# Patient Record
Sex: Female | Born: 1990 | Race: White | Hispanic: No | Marital: Single | Smoking: Current every day smoker
Health system: Southern US, Community
[De-identification: ages and names within clinical notes are randomized; demographics above are authoritative.]

## PROBLEM LIST (undated history)

## (undated) DIAGNOSIS — J029 Acute pharyngitis, unspecified: Secondary | ICD-10-CM

## (undated) DIAGNOSIS — N920 Excessive and frequent menstruation with regular cycle: Secondary | ICD-10-CM

## (undated) DIAGNOSIS — R51 Headache: Secondary | ICD-10-CM

## (undated) DIAGNOSIS — F410 Panic disorder [episodic paroxysmal anxiety] without agoraphobia: Secondary | ICD-10-CM

## (undated) DIAGNOSIS — D649 Anemia, unspecified: Secondary | ICD-10-CM

## (undated) DIAGNOSIS — E039 Hypothyroidism, unspecified: Secondary | ICD-10-CM

## (undated) DIAGNOSIS — F419 Anxiety disorder, unspecified: Secondary | ICD-10-CM

## (undated) DIAGNOSIS — L738 Other specified follicular disorders: Secondary | ICD-10-CM

## (undated) DIAGNOSIS — F32A Depression, unspecified: Secondary | ICD-10-CM

## (undated) DIAGNOSIS — E079 Disorder of thyroid, unspecified: Secondary | ICD-10-CM

## (undated) DIAGNOSIS — F319 Bipolar disorder, unspecified: Secondary | ICD-10-CM

## (undated) DIAGNOSIS — R519 Headache, unspecified: Secondary | ICD-10-CM

## (undated) DIAGNOSIS — T7840XA Allergy, unspecified, initial encounter: Secondary | ICD-10-CM

## (undated) DIAGNOSIS — G43909 Migraine, unspecified, not intractable, without status migrainosus: Secondary | ICD-10-CM

## (undated) HISTORY — DX: Hypothyroidism, unspecified: E03.9

## (undated) HISTORY — DX: Panic disorder (episodic paroxysmal anxiety): F41.0

## (undated) HISTORY — DX: Excessive and frequent menstruation with regular cycle: N92.0

## (undated) HISTORY — DX: Acute pharyngitis, unspecified: J02.9

## (undated) HISTORY — DX: Allergy, unspecified, initial encounter: T78.40XA

## (undated) HISTORY — DX: Other specified follicular disorders: L73.8

## (undated) HISTORY — DX: Disorder of thyroid, unspecified: E07.9

## (undated) HISTORY — DX: Headache, unspecified: R51.9

## (undated) HISTORY — DX: Headache: R51

## (undated) HISTORY — DX: Anemia, unspecified: D64.9

## (undated) HISTORY — DX: Migraine, unspecified, not intractable, without status migrainosus: G43.909

## (undated) HISTORY — PX: TONSILLECTOMY: SHX5217

## (undated) HISTORY — DX: Anxiety disorder, unspecified: F41.9

---
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
## 2011-01-08 ENCOUNTER — Other Ambulatory Visit: Payer: Self-pay | Admitting: Family Medicine

## 2011-01-08 DIAGNOSIS — N920 Excessive and frequent menstruation with regular cycle: Secondary | ICD-10-CM

## 2011-01-12 ENCOUNTER — Other Ambulatory Visit: Payer: Self-pay

## 2011-01-12 ENCOUNTER — Ambulatory Visit
Admission: RE | Admit: 2011-01-12 | Discharge: 2011-01-12 | Disposition: A | Discharge status: Home / Self Care | Payer: PRIVATE HEALTH INSURANCE | Source: Ambulatory Visit | Attending: Family Medicine | Admitting: Family Medicine

## 2011-01-12 DIAGNOSIS — N920 Excessive and frequent menstruation with regular cycle: Secondary | ICD-10-CM

## 2011-01-13 ENCOUNTER — Other Ambulatory Visit: Payer: Self-pay

## 2011-01-18 ENCOUNTER — Other Ambulatory Visit: Payer: Self-pay | Admitting: Family Medicine

## 2011-01-18 DIAGNOSIS — R1011 Right upper quadrant pain: Secondary | ICD-10-CM

## 2011-01-22 ENCOUNTER — Ambulatory Visit
Admission: RE | Admit: 2011-01-22 | Discharge: 2011-01-22 | Disposition: A | Discharge status: Home / Self Care | Payer: PRIVATE HEALTH INSURANCE | Source: Ambulatory Visit | Attending: Family Medicine | Admitting: Family Medicine

## 2011-01-22 DIAGNOSIS — R1011 Right upper quadrant pain: Secondary | ICD-10-CM

## 2011-02-02 ENCOUNTER — Encounter (INDEPENDENT_AMBULATORY_CARE_PROVIDER_SITE_OTHER): Payer: Self-pay | Admitting: Surgery

## 2011-02-11 ENCOUNTER — Encounter (INDEPENDENT_AMBULATORY_CARE_PROVIDER_SITE_OTHER): Payer: Self-pay | Admitting: Surgery

## 2011-02-11 ENCOUNTER — Ambulatory Visit (INDEPENDENT_AMBULATORY_CARE_PROVIDER_SITE_OTHER): Payer: PRIVATE HEALTH INSURANCE | Admitting: Surgery

## 2011-02-11 VITALS — BP 112/72 | HR 60 | Temp 97.2°F | Resp 20 | Ht 59.0 in | Wt 181.1 lb

## 2011-02-11 DIAGNOSIS — K8010 Calculus of gallbladder with chronic cholecystitis without obstruction: Secondary | ICD-10-CM | POA: Insufficient documentation

## 2011-02-11 NOTE — Progress Notes (Signed)
Chief Complaint  Patient presents with  . Cholelithiasis    HPI Carol [name] is a 20 y.o. female. She presents with a six-month history of intermittent right upper quadrant abdominal pain, nausea and constipation. She relates these symptoms to eating greasy foods. Her constipation has improved recently by eating more vegetables and taking a stool softener. She has never had a severe attack that brought her to the emergency room. Her primary care physician check some liver function tests which were normal. An ultrasound showed a 1.6 cm mobile gallstone with no wall thickening and normal common bile duct. She presents now for surgical evaluation. HPI  Past Medical History  Diagnosis Date  . Allergy   . Asthma   . Anxiety   . Thyroid disease   . Menorrhagia   . Other specified disease of hair and hair follicles   . Hypothyroid   . Migraine headache   . Panic disorder   . Anemia   . Sore throat   . Abdominal pain     right   . Constipation   . Nocturnal headaches     due to birth control pills    Past Surgical History  Procedure Date  . Tonsillectomy     History reviewed. No pertinent family history.  Social History History  Substance Use Topics  . Smoking status: Never Smoker   . Smokeless tobacco: Never Used  . Alcohol Use: No    No Known Allergies  Current Outpatient Prescriptions  Medication Sig Dispense Refill  . cephALEXin (KEFLEX) 500 MG capsule       . clonazePAM (KLONOPIN) 0.5 MG tablet Take 0.5 mg by mouth 2 (two) times daily as needed.        . ferrous sulfate 325 (65 FE) MG tablet Take 325 mg by mouth daily with breakfast.        . levothyroxine (SYNTHROID, LEVOTHROID) 88 MCG tablet Take 88 mcg by mouth daily.        . naproxen (NAPROSYN) 500 MG tablet Take 500 mg by mouth 2 (two) times daily with a meal.        . norgestimate-ethinyl estradiol (ORTHO-CYCLEN,SPRINTEC,PREVIFEM) 0.25-35 MG-MCG tablet Take 1 tablet by mouth daily.        Marland Kitchen PARoxetine  (PAXIL) 30 MG tablet Take 30 mg by mouth every morning.        . TRI-SPRINTEC 0.18/0.215/0.25 MG-35 MCG tablet         Review of Systems Review of Systems ROS positive as noted in HPI and also for occasional sore throat and headaches. Blood pressure 112/72, pulse 60, temperature 97.2 F (36.2 C), resp. rate 20, height 4\' 11"  (1.499 m), weight 181 lb 2 oz (82.158 kg).  Physical Exam Physical Exam WDWN in NAD HEENT:  EOMI, sclera anicteric Neck:  No masses, no thyromegaly Lungs:  CTA bilaterally; normal respiratory effort CV:  Regular rate and rhythm; no murmurs Abd:  +bowel sounds, soft, tender in RUQ, epigastrium, no masses Ext:  Well-perfused; no edema Skin:  Warm, dry; no sign of jaundice  Data Reviewed U/S, labs as noted in HPI  Assessment    Chronic calculus cholecystitis    Plan    Recommend laparoscopic cholecystectomy with intraoperative cholangiogram.  I discussed the procedure in detail.  The patient was given Agricultural engineer.  We discussed the risks and benefits of a laparoscopic cholecystectomy including, but not limited to bleeding, infection, injury to surrounding structures such as the intestine or liver, bile leak, retained gallstones,  need to convert to an open procedure, prolonged diarrhea, blood clots such as  DVT, common bile duct injury, anesthesia risks, and possible need for additional procedures.  We discussed the typical post-operative recovery course.        Carol [name] K. 02/11/2011, 10:21 AM

## 2011-02-11 NOTE — Patient Instructions (Signed)
We will schedule your surgery today. 

## 2011-03-05 ENCOUNTER — Encounter: Payer: PRIVATE HEALTH INSURANCE | Admitting: Hematology and Oncology

## 2011-03-12 ENCOUNTER — Other Ambulatory Visit (INDEPENDENT_AMBULATORY_CARE_PROVIDER_SITE_OTHER): Payer: Self-pay | Admitting: Surgery

## 2011-03-12 DIAGNOSIS — K8010 Calculus of gallbladder with chronic cholecystitis without obstruction: Secondary | ICD-10-CM

## 2011-03-12 HISTORY — PX: CHOLECYSTECTOMY: SHX55

## 2011-03-17 ENCOUNTER — Telehealth: Payer: Self-pay | Admitting: Oncology

## 2011-03-17 NOTE — Telephone Encounter (Signed)
per pts request called grandmother and provided appt d/t for 03/18/2011

## 2011-03-18 ENCOUNTER — Telehealth: Payer: Self-pay | Admitting: *Deleted

## 2011-03-18 NOTE — Telephone Encounter (Signed)
CALLED PATIENT GRANDMOTHER INFORM OF THE NEW DATE AND TIME OF THE NEW APPOINTMENT CONFIRMED OVER THE PHONE.

## 2011-03-23 ENCOUNTER — Ambulatory Visit: Payer: PRIVATE HEALTH INSURANCE

## 2011-03-23 ENCOUNTER — Other Ambulatory Visit: Payer: PRIVATE HEALTH INSURANCE | Admitting: Lab

## 2011-03-23 ENCOUNTER — Telehealth: Payer: Self-pay | Admitting: Oncology

## 2011-03-23 ENCOUNTER — Ambulatory Visit: Payer: PRIVATE HEALTH INSURANCE | Admitting: Oncology

## 2011-03-23 ENCOUNTER — Ambulatory Visit (HOSPITAL_BASED_OUTPATIENT_CLINIC_OR_DEPARTMENT_OTHER): Payer: PRIVATE HEALTH INSURANCE | Admitting: Oncology

## 2011-03-23 VITALS — BP 128/79 | HR 97 | Temp 97.8°F | Ht <= 58 in | Wt 183.7 lb

## 2011-03-23 DIAGNOSIS — D649 Anemia, unspecified: Secondary | ICD-10-CM

## 2011-03-23 NOTE — Telephone Encounter (Signed)
Gave patient appointment for 03-24-2011 lab only at 10:00am

## 2011-03-24 ENCOUNTER — Other Ambulatory Visit (HOSPITAL_BASED_OUTPATIENT_CLINIC_OR_DEPARTMENT_OTHER): Payer: PRIVATE HEALTH INSURANCE | Admitting: Lab

## 2011-03-24 DIAGNOSIS — D649 Anemia, unspecified: Secondary | ICD-10-CM

## 2011-03-24 LAB — CBC & DIFF AND RETIC
BASO%: 0.5 % (ref 0.0–2.0)
EOS%: 5.5 % (ref 0.0–7.0)
LYMPH%: 25.4 % (ref 14.0–49.7)
MCH: 25.4 pg (ref 25.1–34.0)
MCHC: 32.7 g/dL (ref 31.5–36.0)
MCV: 77.8 fL — ABNORMAL LOW (ref 79.5–101.0)
MONO#: 0.8 10*3/uL (ref 0.1–0.9)
MONO%: 7.8 % (ref 0.0–14.0)
Platelets: 402 10*3/uL — ABNORMAL HIGH (ref 145–400)
RBC: 4.41 10*6/uL (ref 3.70–5.45)
Retic %: 2.32 % — ABNORMAL HIGH (ref 0.70–2.10)
WBC: 10.1 10*3/uL (ref 3.9–10.3)

## 2011-03-24 LAB — MORPHOLOGY: PLT EST: INCREASED

## 2011-03-24 NOTE — Progress Notes (Signed)
Patient History and Physical   Carol [name] [id number] June 06, 1990 20 y.o. 03/24/2011  CC: Dr Juliet Rude  Chief Complaint: Anemia  HPI:  This is a 20 year old single female from [place], referred for evaluation of anemia. She apparently was noted to be anemic in August of this year. SHe recently underwent a cholecystectomy in October and was referred for further evaluation of anemia. History of menorrhagia which is improved since been on a birth control pill. She does take oral iron once a day. She has no other significant problems that might be contributing to anemia.  PMH: Past Medical History  Diagnosis Date  . Allergy   . Asthma   . Anxiety   . Thyroid disease   . Menorrhagia   . Other specified disease of hair and hair follicles   . Hypothyroid   . Migraine headache   . Panic disorder   . Anemia   . Sore throat   . Abdominal pain     right   . Constipation   . Nocturnal headaches     due to birth control pills    Past Surgical History  Procedure Date  . Tonsillectomy    cholecystectomy 2012  Allergies: Allergies  Allergen Reactions  . Codeine Anaphylaxis    Medications: Medications Prior to Admission  Medication Sig Dispense Refill  . clonazePAM (KLONOPIN) 0.5 MG tablet Take 0.5 mg by mouth 2 (two) times daily as needed.        . ferrous sulfate 325 (65 FE) MG tablet Take 325 mg by mouth daily with breakfast.        . levothyroxine (SYNTHROID, LEVOTHROID) 88 MCG tablet Take 88 mcg by mouth daily.        . norgestimate-ethinyl estradiol (ORTHO-CYCLEN,SPRINTEC,PREVIFEM) 0.25-35 MG-MCG tablet Take 1 tablet by mouth daily.       Marland Kitchen PARoxetine (PAXIL) 30 MG tablet Take 30 mg by mouth every morning.        . TRI-SPRINTEC 0.18/0.215/0.25 MG-35 MCG tablet       . cephALEXin (KEFLEX) 500 MG capsule       . naproxen (NAPROSYN) 500 MG tablet Take 500 mg by mouth 2 (two) times daily with a meal.         No current facility-administered medications on file  as of 03/23/2011.    Social History:   reports that she has never smoked. She has never used smokeless tobacco. She reports that she does not drink alcohol or use illicit drugs. She is a high Garment/textile technologist, lives at home and looks after a-year-old brother Family History: No family history on file. She has younger brothers 45 and 14 as well as a tumor and. There is a history in the family of any neurological disorders.  Review of Systems: Constitutional ROS: Fever -no, Chills, Night Sweats, Anorexia, Pain -no Cardiovascular ROS: positive for - dyspnea on exertion Respiratory ROS: negative Neurological ROS: negative Dermatological ROS: negative ENT ROS: negative Gastrointestinal ROS: no abdominal pain, change in bowel habits, or black or bloody stools Genito-Urinary ROS: no dysuria, trouble voiding, or hematuria Hematological and Lymphatic ROS: negative Breast ROS: negative for breast lumps Musculoskeletal ROS: negative Remaining ROS negative.  Physical Exam: Blood pressure 128/79, pulse 97, temperature 97.8 F (36.6 C), height 4\' 10"  (1.473 m), weight 183 lb 11.2 oz (83.326 kg). General appearance: alert and cooperative Head: Normocephalic, without obvious abnormality, atraumatic Neck: no adenopathy, no carotid bruit, no JVD, supple, symmetrical, trachea midline and thyroid not enlarged, symmetric, no tenderness/mass/nodules  Lymph nodes: Cervical, supraclavicular, and axillary nodes normal. Lungs; normal Cardiac; normal Abdomen; negative for adenopathy, splenomegaly or masses. CNS; grossly normal Extremities no petechia purpura  Lab Results: No results found for this basename: WBC, HGB, HCT, MCV, PLT     Chemistry   No results found for this basename: NA, K, CL, CO2, BUN, CREATININE, GLU   No results found for this basename: CALCIUM, ALKPHOS, AST, ALT, BILITOT       Radiological Studies: n/a  Impression and Plan: 20 yo with history of anemia likely secondary to  menorrhagia. I will await labs and have recommended increasing ferrous sulfate to 3x/day. i will obtain labs and see her in f/u in 2 weeks.     Pierce Crane, MD 03/24/2011, 12:21 AM

## 2011-03-25 LAB — COMPREHENSIVE METABOLIC PANEL
ALT: 16 U/L (ref 0–35)
AST: 13 U/L (ref 0–37)
Albumin: 3.8 g/dL (ref 3.5–5.2)
Alkaline Phosphatase: 102 U/L (ref 39–117)
BUN: 6 mg/dL (ref 6–23)
Chloride: 105 mEq/L (ref 96–112)
Potassium: 4 mEq/L (ref 3.5–5.3)
Sodium: 138 mEq/L (ref 135–145)
Total Protein: 7.1 g/dL (ref 6.0–8.3)

## 2011-03-25 LAB — IRON AND TIBC: UIBC: 343 ug/dL (ref 125–400)

## 2011-03-25 LAB — FOLATE RBC: RBC Folate: 1053 ng/mL (ref >=366)

## 2011-03-29 ENCOUNTER — Telehealth: Payer: Self-pay | Admitting: Oncology

## 2011-03-29 NOTE — Telephone Encounter (Signed)
pts grandmother called about lab results and I informed her that the MD will discuss on 11/23 when she comes in for office visit

## 2011-03-30 ENCOUNTER — Telehealth (INDEPENDENT_AMBULATORY_CARE_PROVIDER_SITE_OTHER): Payer: Self-pay | Admitting: Surgery

## 2011-04-02 ENCOUNTER — Ambulatory Visit (HOSPITAL_BASED_OUTPATIENT_CLINIC_OR_DEPARTMENT_OTHER): Payer: PRIVATE HEALTH INSURANCE | Admitting: Oncology

## 2011-04-02 ENCOUNTER — Telehealth: Payer: Self-pay | Admitting: *Deleted

## 2011-04-02 VITALS — BP 129/78 | HR 102 | Temp 98.1°F | Ht <= 58 in | Wt 183.9 lb

## 2011-04-02 DIAGNOSIS — D509 Iron deficiency anemia, unspecified: Secondary | ICD-10-CM

## 2011-04-02 DIAGNOSIS — D649 Anemia, unspecified: Secondary | ICD-10-CM

## 2011-04-02 NOTE — Progress Notes (Signed)
Carol [name] is here with her grandmother. Her iron stores confirm iron deficiency, likely related to her menorrhagia. She typically does not take iron on a regular basis, I have offered her iv iron, but she would prefer to take the pills. She confirmed understanding of this. i will see her in f/u in 3 months.

## 2011-04-02 NOTE — Telephone Encounter (Signed)
Gave patient appointment for 06-2011 

## 2011-04-06 ENCOUNTER — Encounter (INDEPENDENT_AMBULATORY_CARE_PROVIDER_SITE_OTHER): Payer: Self-pay | Admitting: Surgery

## 2011-04-06 ENCOUNTER — Ambulatory Visit (INDEPENDENT_AMBULATORY_CARE_PROVIDER_SITE_OTHER): Payer: PRIVATE HEALTH INSURANCE | Admitting: Surgery

## 2011-04-06 VITALS — BP 126/78 | HR 70 | Temp 97.2°F | Resp 16 | Ht 59.0 in | Wt 181.0 lb

## 2011-04-06 DIAGNOSIS — K8010 Calculus of gallbladder with chronic cholecystitis without obstruction: Secondary | ICD-10-CM

## 2011-04-06 NOTE — Patient Instructions (Signed)
Fiber supplement for the diarrhea.  Use a heating pad and ibuprofen for the muscle soreness.

## 2011-04-06 NOTE — Progress Notes (Signed)
Filed Vitals:   04/06/11 1626  BP: 126/78  Pulse: 70  Temp: 97.2 F (36.2 C)  Resp: 16   The patient is status post laparoscopic cholecystectomy on 03/12/11 for chronic calculus cholecystitis.  She is having no more nausea and her constipation has resolved. She has some diarrhea with greasy foods.  She complains of some mild RUQ pain with lifting and twisting.  Her incisions are well-healed with no sign of infection.  She does have some skin irritation from some tape, but this seems to be improving.  She is mildly tender around her RUQ port sites, but no sign of infection or hematoma.  Her midline incisions are well-healed.    She should add a fiber supplement for her diarrhea and limit the fat in her diet.  She may use a heating pad and ibuprofen PRN for the muscle soreness.  Follow-up PRN

## 2011-04-20 ENCOUNTER — Encounter (INDEPENDENT_AMBULATORY_CARE_PROVIDER_SITE_OTHER): Payer: Self-pay | Admitting: Surgery

## 2011-04-21 ENCOUNTER — Encounter (INDEPENDENT_AMBULATORY_CARE_PROVIDER_SITE_OTHER): Payer: Self-pay

## 2011-04-21 ENCOUNTER — Encounter (INDEPENDENT_AMBULATORY_CARE_PROVIDER_SITE_OTHER): Payer: Self-pay | Admitting: Family Medicine

## 2011-06-29 ENCOUNTER — Other Ambulatory Visit (HOSPITAL_BASED_OUTPATIENT_CLINIC_OR_DEPARTMENT_OTHER): Payer: PRIVATE HEALTH INSURANCE | Admitting: Lab

## 2011-06-29 DIAGNOSIS — D649 Anemia, unspecified: Secondary | ICD-10-CM

## 2011-06-29 LAB — CBC & DIFF AND RETIC
BASO%: 0.2 % (ref 0.0–2.0)
Eosinophils Absolute: 0.1 10*3/uL (ref 0.0–0.5)
HCT: 35.6 % (ref 34.8–46.6)
Immature Retic Fract: 16.8 % — ABNORMAL HIGH (ref 1.60–10.00)
LYMPH%: 24.7 % (ref 14.0–49.7)
MCHC: 32.6 g/dL (ref 31.5–36.0)
MONO#: 1.8 10*3/uL — ABNORMAL HIGH (ref 0.1–0.9)
NEUT#: 13.4 10*3/uL — ABNORMAL HIGH (ref 1.5–6.5)
NEUT%: 65.6 % (ref 38.4–76.8)
Platelets: 476 10*3/uL — ABNORMAL HIGH (ref 145–400)
Retic %: 3.73 % — ABNORMAL HIGH (ref 0.70–2.10)
WBC: 20.5 10*3/uL — ABNORMAL HIGH (ref 3.9–10.3)
lymph#: 5.1 10*3/uL — ABNORMAL HIGH (ref 0.9–3.3)

## 2011-06-29 LAB — MORPHOLOGY

## 2011-06-29 LAB — FERRITIN: Ferritin: 28 ng/mL (ref 10–291)

## 2011-06-29 LAB — IRON AND TIBC: %SAT: 6 % — ABNORMAL LOW (ref 20–55)

## 2011-07-06 ENCOUNTER — Ambulatory Visit: Payer: PRIVATE HEALTH INSURANCE | Admitting: Lab

## 2011-07-06 ENCOUNTER — Ambulatory Visit (HOSPITAL_BASED_OUTPATIENT_CLINIC_OR_DEPARTMENT_OTHER): Payer: PRIVATE HEALTH INSURANCE | Admitting: Oncology

## 2011-07-06 ENCOUNTER — Telehealth: Payer: Self-pay | Admitting: Oncology

## 2011-07-06 VITALS — BP 137/83 | HR 130 | Temp 97.8°F | Ht 59.0 in | Wt 184.8 lb

## 2011-07-06 DIAGNOSIS — D649 Anemia, unspecified: Secondary | ICD-10-CM

## 2011-07-06 DIAGNOSIS — C50919 Malignant neoplasm of unspecified site of unspecified female breast: Secondary | ICD-10-CM

## 2011-07-06 LAB — CBC & DIFF AND RETIC
BASO%: 0.2 % (ref 0.0–2.0)
EOS%: 1.4 % (ref 0.0–7.0)
HCT: 36.8 % (ref 34.8–46.6)
Immature Retic Fract: 5.4 % (ref 1.60–10.00)
MCH: 25.1 pg (ref 25.1–34.0)
MCHC: 32.6 g/dL (ref 31.5–36.0)
MONO#: 0.9 10*3/uL (ref 0.1–0.9)
NEUT%: 73.2 % (ref 38.4–76.8)
RBC: 4.79 10*6/uL (ref 3.70–5.45)
Retic %: 1.79 % (ref 0.70–2.10)
WBC: 15.1 10*3/uL — ABNORMAL HIGH (ref 3.9–10.3)
lymph#: 3 10*3/uL (ref 0.9–3.3)

## 2011-07-06 LAB — MORPHOLOGY

## 2011-07-06 LAB — CHCC SMEAR

## 2011-07-06 NOTE — Telephone Encounter (Signed)
gve the pt her may 2013 appt calendar 

## 2011-07-06 NOTE — Progress Notes (Signed)
Hematology and Oncology Follow Up Visit  Carol [name] [id number] Aug 19, 1990 20 y.o. 07/06/2011 8:52 PM PCP  Principle Diagnosis: Anemia likely iron deficient  Interim History:  There have been no intercurrent illness, hospitalizations or medication changes. She has felt reasonably well and is taking her iron more or less on a regular basis  Medications: I have reviewed the patient's current medications.  Allergies:  Allergies  Allergen Reactions  . Codeine Anaphylaxis    Past Medical History, Surgical history, Social history, and Family History were reviewed and updated.  Review of Systems: Constitutional:  Negative for fever, chills, night sweats, anorexia, weight loss, pain. Cardiovascular: no chest pain or dyspnea on exertion Respiratory: negative Neurological: negative Dermatological: negative ENT: negative Skin Gastrointestinal: negative Genito-Urinary: negative Hematological and Lymphatic: negative Breast: negative Musculoskeletal: negative Remaining ROS negative.  Physical Exam: Blood pressure 137/83, pulse 130, temperature 97.8 F (36.6 C), temperature source Oral, height 4\' 11"  (1.499 m), weight 184 lb 12.8 oz (83.825 kg). ECOG:  General appearance: alert, cooperative and appears stated age  Lab Results: Lab Results  Component Value Date   WBC 15.1* 07/06/2011   HGB 12.0 07/06/2011   HCT 36.8 07/06/2011   MCV 76.8* 07/06/2011   PLT 468* 07/06/2011     Chemistry      Component Value Date/Time   NA 138 03/24/2011 1006   K 4.0 03/24/2011 1006   CL 105 03/24/2011 1006   CO2 23 03/24/2011 1006   BUN 6 03/24/2011 1006   CREATININE 0.63 03/24/2011 1006      Component Value Date/Time   CALCIUM 9.6 03/24/2011 1006   ALKPHOS 102 03/24/2011 1006   AST 13 03/24/2011 1006   ALT 16 03/24/2011 1006   BILITOT 0.2* 03/24/2011 1006      .pathology. Radiological Studies: chest X-ray n/a Mammogram n/a Bone density n/a  Impression and Plan: History of  anemia with a low iron stores. Of note is that her CBC checked a week ago showed an elevated white count 20,000. I reviewed the smear and repeated today. Today's white count is 1 15,000. Once again I re\re dyspnea. There are a few atypical lymphocytes but no specific evidence for blasts. There is no role left shift. There is RBC morphology was fairly normal. The patient claims to have had a URTI over the past 10 days or so. I've encouraged her to take her oral iron. I will repeat a CBC in 6 weeks to recheck her white cell count. If this continues to be elevated she may require bone marrow biopsy More than 50% of the visit was spent in patient-related counselling   Pierce Crane, MD 2/26/20138:52 PM

## 2011-09-23 ENCOUNTER — Other Ambulatory Visit (HOSPITAL_BASED_OUTPATIENT_CLINIC_OR_DEPARTMENT_OTHER): Payer: PRIVATE HEALTH INSURANCE | Admitting: Lab

## 2011-09-23 DIAGNOSIS — D649 Anemia, unspecified: Secondary | ICD-10-CM

## 2011-09-23 LAB — CBC WITH DIFFERENTIAL/PLATELET
Basophils Absolute: 0.1 10*3/uL (ref 0.0–0.1)
HCT: 34.7 % — ABNORMAL LOW (ref 34.8–46.6)
HGB: 11.3 g/dL — ABNORMAL LOW (ref 11.6–15.9)
LYMPH%: 26.2 % (ref 14.0–49.7)
MCH: 24.9 pg — ABNORMAL LOW (ref 25.1–34.0)
MONO#: 0.7 10*3/uL (ref 0.1–0.9)
NEUT%: 64.6 % (ref 38.4–76.8)
Platelets: 438 10*3/uL — ABNORMAL HIGH (ref 145–400)
WBC: 9.9 10*3/uL (ref 3.9–10.3)
lymph#: 2.6 10*3/uL (ref 0.9–3.3)

## 2011-09-23 LAB — MORPHOLOGY

## 2011-09-30 ENCOUNTER — Telehealth: Payer: Self-pay | Admitting: Oncology

## 2011-09-30 ENCOUNTER — Ambulatory Visit (HOSPITAL_BASED_OUTPATIENT_CLINIC_OR_DEPARTMENT_OTHER): Payer: PRIVATE HEALTH INSURANCE | Admitting: Oncology

## 2011-09-30 VITALS — BP 110/72 | HR 125 | Temp 98.2°F | Ht 59.0 in | Wt 190.0 lb

## 2011-09-30 DIAGNOSIS — D649 Anemia, unspecified: Secondary | ICD-10-CM

## 2011-09-30 NOTE — Progress Notes (Signed)
Hematology and Oncology Follow Up Visit  Carol [name] [id number] [birth year] 20 y.o. 09/30/2011 3:49 PM PCP  Principle Diagnosis: Anemia likely iron deficient  Interim History:  There have been no intercurrent illness, hospitalizations or medication changes. She has felt reasonably well and is taking her iron more or less on a regular basis  Medications: I have reviewed the patient's current medications.  Allergies:  Allergies  Allergen Reactions  . Codeine Anaphylaxis    Past Medical History, Surgical history, Social history, and Family History were reviewed and updated.  Review of Systems: Constitutional:  Negative for fever, chills, night sweats, anorexia, weight loss, pain. Cardiovascular: no chest pain or dyspnea on exertion Respiratory: negative Neurological: negative Dermatological: negative ENT: negative Skin Gastrointestinal: negative Genito-Urinary: negative Hematological and Lymphatic: negative Breast: negative Musculoskeletal: negative Remaining ROS negative.  Physical Exam: Blood pressure 110/72, pulse 125, temperature 98.2 F (36.8 C), height 4\' 11"  (1.499 m), weight 190 lb (86.183 kg). ECOG:  General appearance: alert, cooperative and appears stated age Head and neck exams unremarkable, lungs are clear to auscultation and percussion. Cardiovascular examination reveals normal heart sounds no heaves thrills bruits or murmurs. Abdominal examination does not reveal any hepatomegaly or splenomegaly. Extremities are normal.  Lab Results: Lab Results  Component Value Date   WBC 9.9 09/23/2011   HGB 11.3* 09/23/2011   HCT 34.7* 09/23/2011   MCV 76.6* 09/23/2011   PLT 438* 09/23/2011     Chemistry      Component Value Date/Time   NA 138 03/24/2011 1006   K 4.0 03/24/2011 1006   CL 105 03/24/2011 1006   CO2 23 03/24/2011 1006   BUN 6 03/24/2011 1006   CREATININE 0.63 03/24/2011 1006      Component Value Date/Time   CALCIUM 9.6 03/24/2011 1006   ALKPHOS 102 03/24/2011 1006   AST 13 03/24/2011 1006   ALT 16 03/24/2011 1006   BILITOT 0.2* 03/24/2011 1006      .pathology. Radiological Studies: chest X-ray n/a Mammogram n/a Bone density n/a  Impression and Plan: History of anemia with a low iron stores. She is intolerant of oral iron replacement. We discussed going ahead with IV iron infusion and she has agreed. Will schedule this for next week. I will repeat her labs and see her in about 4 months time More than 50% of the visit was spent in patient-related counselling   Pierce Crane, MD 5/23/20133:49 PM

## 2011-09-30 NOTE — Telephone Encounter (Signed)
gve the pt her nov 2013 appt calendar along with the iron infusion appt for may

## 2011-10-08 ENCOUNTER — Ambulatory Visit (HOSPITAL_BASED_OUTPATIENT_CLINIC_OR_DEPARTMENT_OTHER): Payer: PRIVATE HEALTH INSURANCE

## 2011-10-08 VITALS — BP 117/78 | HR 101 | Temp 98.3°F

## 2011-10-08 DIAGNOSIS — K8010 Calculus of gallbladder with chronic cholecystitis without obstruction: Secondary | ICD-10-CM

## 2011-10-08 DIAGNOSIS — D649 Anemia, unspecified: Secondary | ICD-10-CM

## 2011-10-08 MED ORDER — SODIUM CHLORIDE 0.9 % IV SOLN
1020.0000 mg | Freq: Once | INTRAVENOUS | Status: AC
  Administered 2011-10-08: 1020 mg via INTRAVENOUS
  Filled 2011-10-08: qty 34

## 2011-10-08 MED ORDER — SODIUM CHLORIDE 0.9 % IV SOLN
Freq: Once | INTRAVENOUS | Status: AC
  Administered 2011-10-08: 10:00:00 via INTRAVENOUS

## 2011-10-08 NOTE — Patient Instructions (Signed)
Ferumoxytol injection What is this medicine? FERUMOXYTOL is an iron complex. Iron is used to make healthy red blood cells, which carry oxygen and nutrients throughout the body. This medicine is used to treat iron deficiency anemia in people with chronic kidney disease. This medicine may be used for other purposes; ask your health care provider or pharmacist if you have questions. What should I tell my health care provider before I take this medicine? They need to know if you have any of these conditions: -anemia not caused by low iron levels -high levels of iron in the blood -magnetic resonance imaging (MRI) test scheduled -an unusual or allergic reaction to iron, other medicines, foods, dyes, or preservatives -pregnant or trying to get pregnant -breast-feeding How should I use this medicine? This medicine is for infusion into a vein. It is given by a health care professional in a hospital or clinic setting. Talk to your pediatrician regarding the use of this medicine in children. Special care may be needed. Overdosage: If you think you've taken too much of this medicine contact a poison control center or emergency room at once. Overdosage: If you think you have taken too much of this medicine contact a poison control center or emergency room at once. NOTE: This medicine is only for you. Do not share this medicine with others. What if I miss a dose? It is important not to miss your dose. Call your doctor or health care professional if you are unable to keep an appointment. What may interact with this medicine? This medicine may interact with the following medications: -other iron products This list may not describe all possible interactions. Give your health care provider a list of all the medicines, herbs, non-prescription drugs, or dietary supplements you use. Also tell them if you smoke, drink alcohol, or use illegal drugs. Some items may interact with your medicine. What should I watch  for while using this medicine? Visit your doctor or healthcare professional regularly. Tell your doctor or healthcare professional if your symptoms do not start to get better or if they get worse. You may need blood work done while you are taking this medicine. You may need to follow a special diet. Talk to your doctor. Foods that contain iron include: whole grains/cereals, dried fruits, beans, or peas, leafy green vegetables, and organ meats (liver, kidney). What side effects may I notice from receiving this medicine? Side effects that you should report to your doctor or health care professional as soon as possible: -allergic reactions like skin rash, itching or hives, swelling of the face, lips, or tongue -breathing problems -changes in blood pressure -feeling faint or lightheaded, falls -fever or chills -flushing, sweating, or hot feelings -swelling of the ankles or feet Side effects that usually do not require medical attention (Report these to your doctor or health care professional if they continue or are bothersome.): -diarrhea -headache -nausea, vomiting -stomach pain This list may not describe all possible side effects. Call your doctor for medical advice about side effects. You may report side effects to FDA at 1-800-FDA-1088. Where should I keep my medicine? This drug is given in a hospital or clinic and will not be stored at home. NOTE: This sheet is a summary. It may not cover all possible information. If you have questions about this medicine, talk to your doctor, pharmacist, or health care provider.  2012, [employer] Standard. (01/17/2008 9:48:25 PM) 

## 2011-11-14 IMAGING — US US ABDOMEN LIMITED
1 series · 14 of 25 positions shown · non-contrast
Comparison: None.

CLINICAL DATA: Right upper quadrant pain

LIMITED ABDOMINAL ULTRASOUND - RIGHT UPPER QUADRANT

[Series 1: us abdomen limited · 0.39mm/px · 14 of 44 slices shown]
[im 1/44]
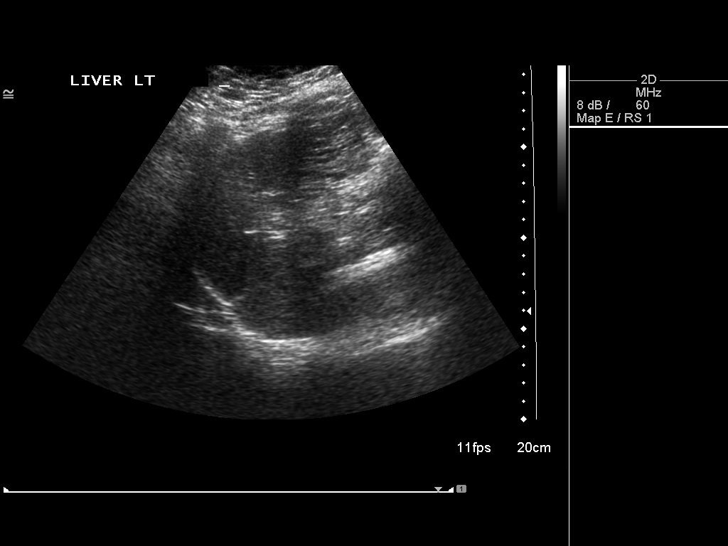
[im 4/44]
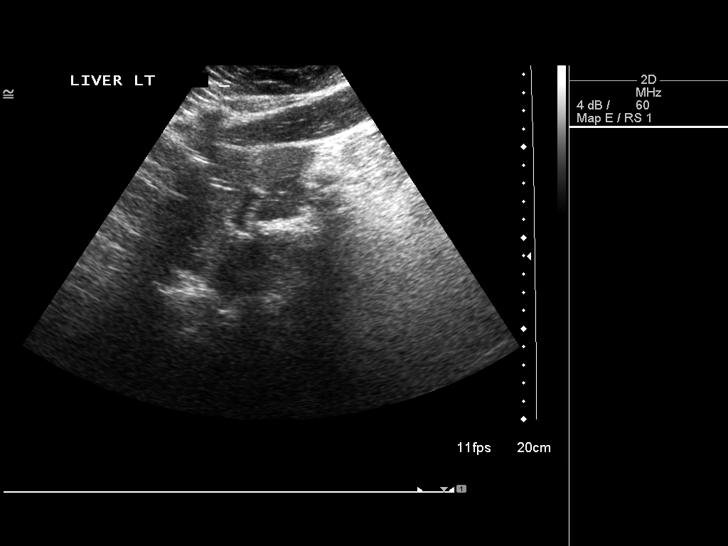
[im 8/44]
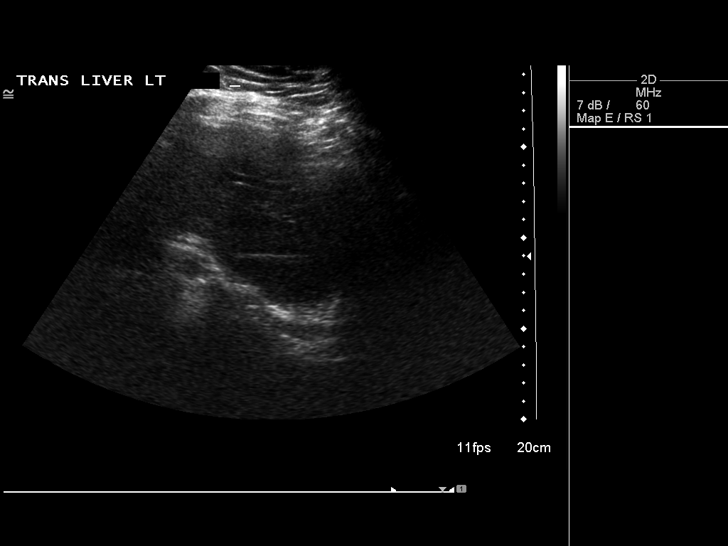
[im 11/44]
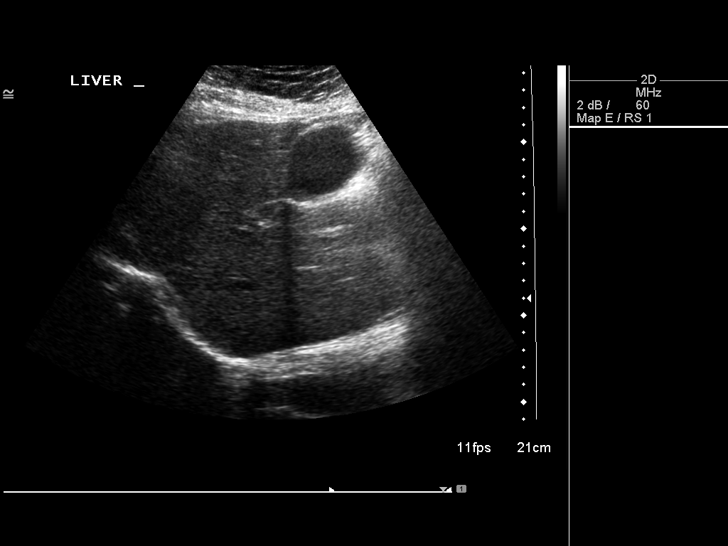
[im 15/44]
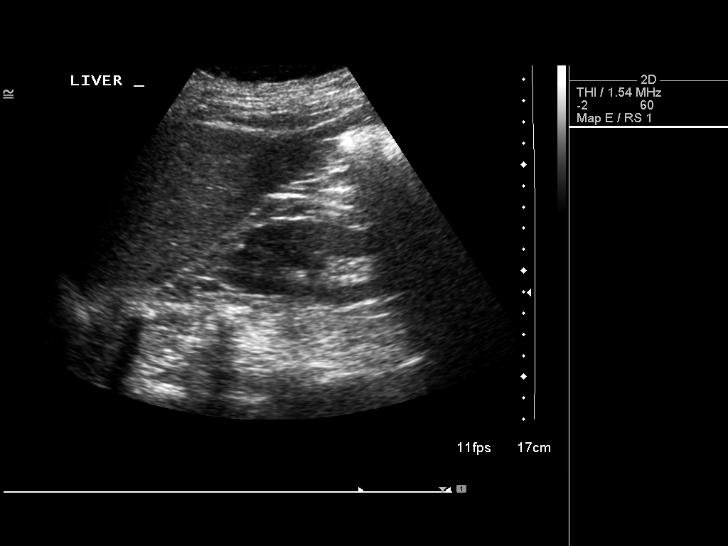
[im 17/44]
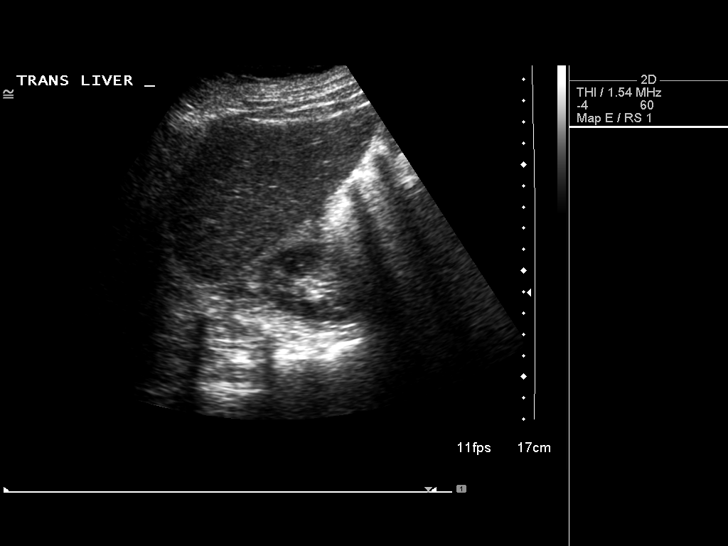
[im 20/44]
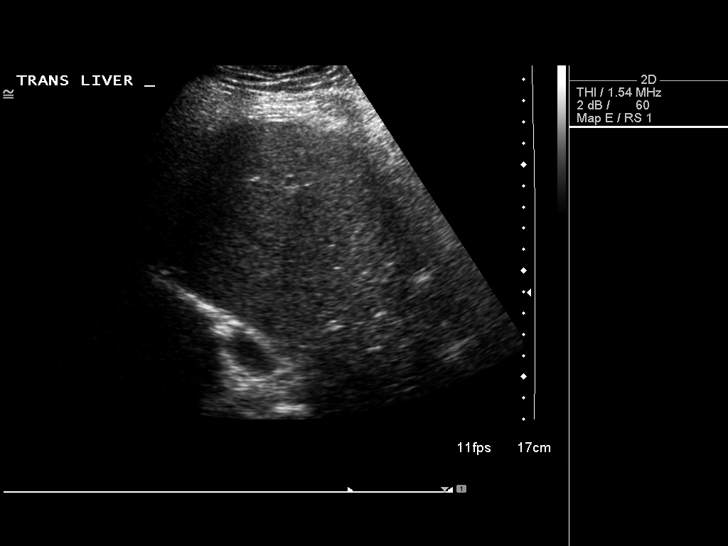
[im 24/44]
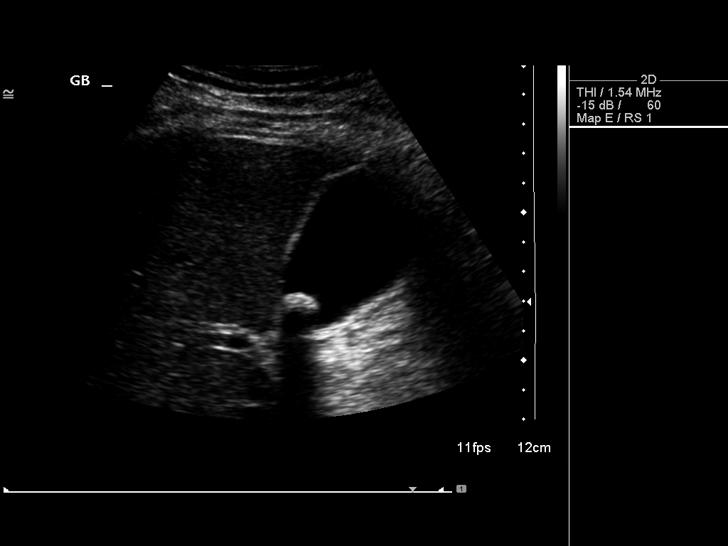
[im 27/44]
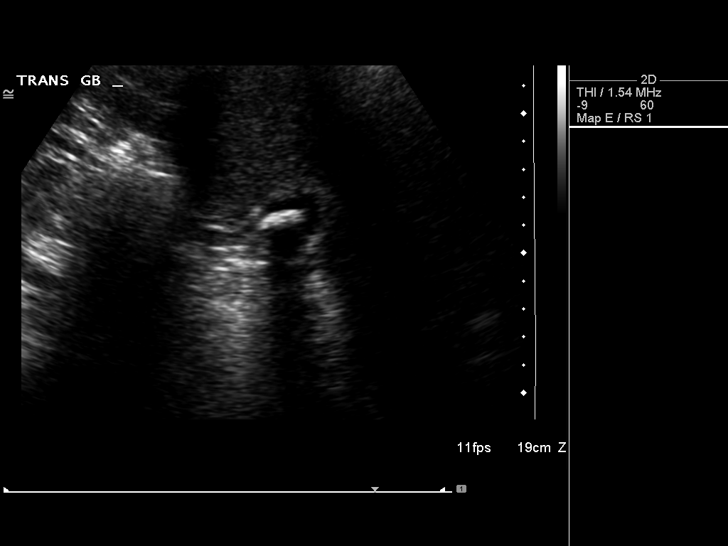
[im 29/44]
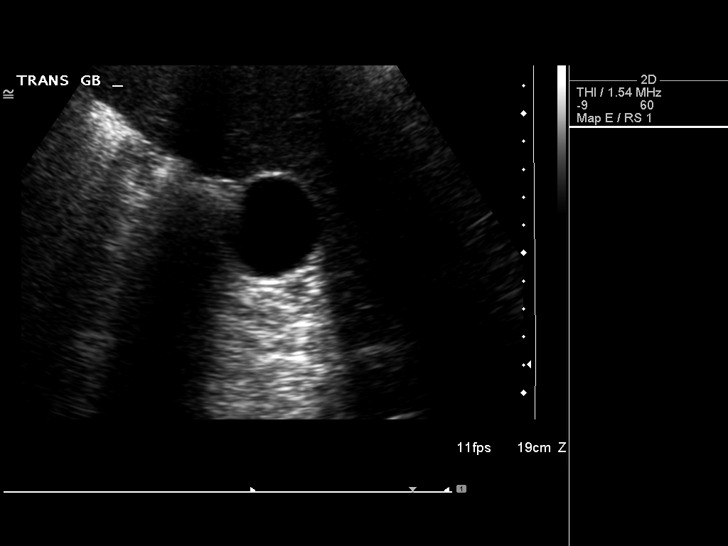
[im 33/44]
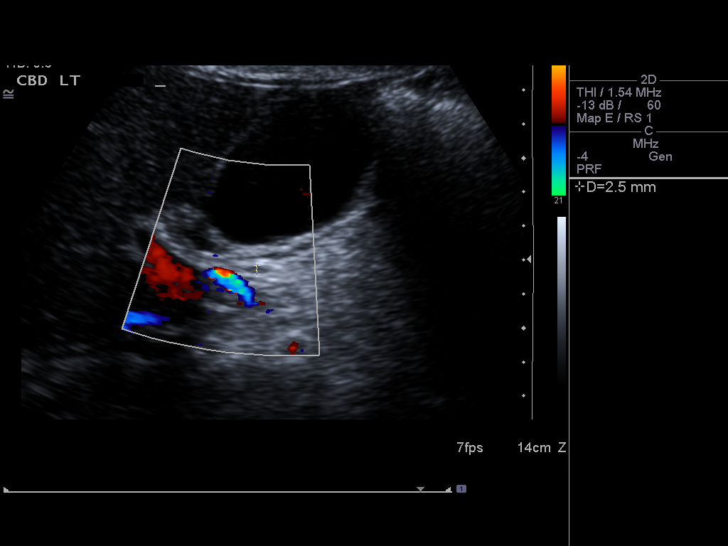
[im 36/44]
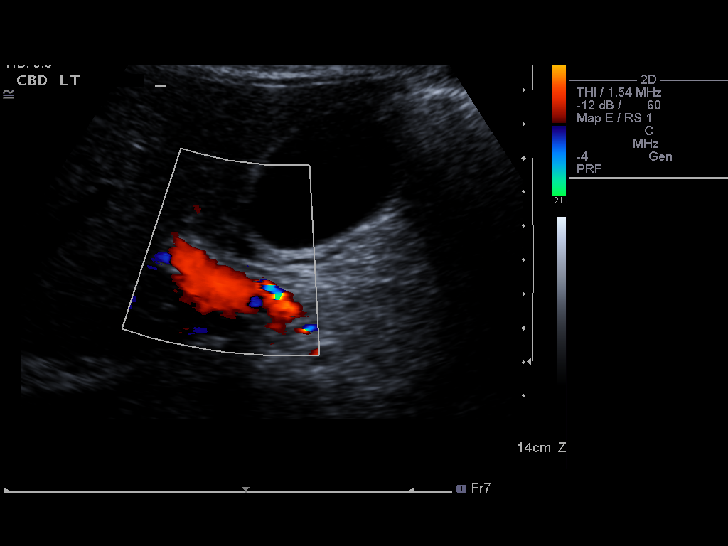
[im 40/44]
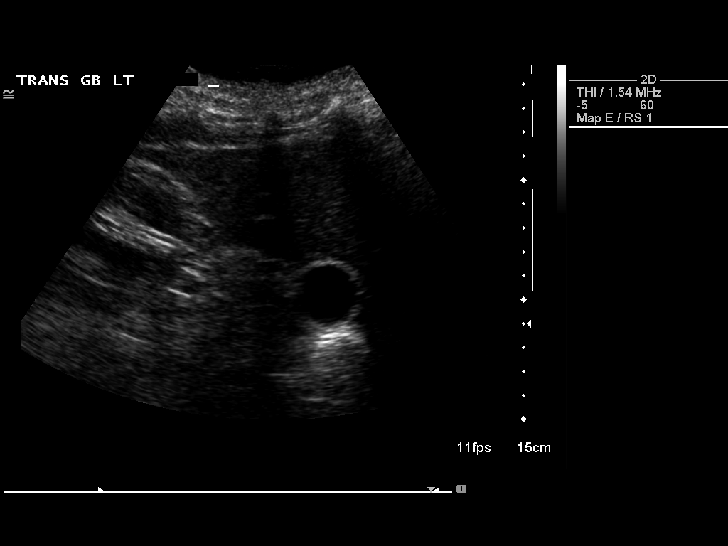
[im 44/44]
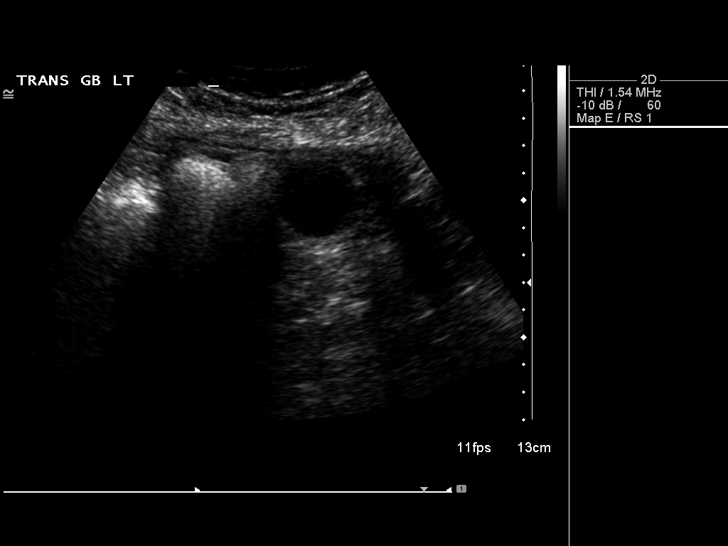

[14 of 25 positions shown; findings below may reference images not displayed]

FINDINGS: Gallbladder:  The gallbladder is visualized and there is a single
gallstone of 1.6 cm which does appear mobile.  No pain is present
over the gallbladder with compression.

Common bile duct:  The common bile duct is normal measuring 3 mm in
diameter.

Liver:  The liver is slightly echogenic suggesting mild fatty
infiltration.  No focal abnormality is seen.
IMPRESSION: 1.  Single 1.6 cm gallstone within the gallbladder.  No present
evidence of acute cholecystitis.
2.  Suspect mild fatty infiltration of the liver.

## 2012-01-27 ENCOUNTER — Other Ambulatory Visit: Payer: PRIVATE HEALTH INSURANCE | Admitting: Lab

## 2012-02-03 ENCOUNTER — Ambulatory Visit: Payer: PRIVATE HEALTH INSURANCE | Admitting: Oncology

## 2012-03-31 ENCOUNTER — Ambulatory Visit (HOSPITAL_BASED_OUTPATIENT_CLINIC_OR_DEPARTMENT_OTHER): Payer: PRIVATE HEALTH INSURANCE | Admitting: Oncology

## 2012-03-31 ENCOUNTER — Other Ambulatory Visit (HOSPITAL_BASED_OUTPATIENT_CLINIC_OR_DEPARTMENT_OTHER): Payer: PRIVATE HEALTH INSURANCE | Admitting: Lab

## 2012-03-31 ENCOUNTER — Telehealth: Payer: Self-pay | Admitting: *Deleted

## 2012-03-31 VITALS — BP 119/80 | HR 112 | Temp 98.9°F | Resp 20 | Ht 59.0 in | Wt 199.2 lb

## 2012-03-31 DIAGNOSIS — D649 Anemia, unspecified: Secondary | ICD-10-CM

## 2012-03-31 LAB — CBC & DIFF AND RETIC
Basophils Absolute: 0 10*3/uL (ref 0.0–0.1)
EOS%: 1.1 % (ref 0.0–7.0)
HCT: 37.5 % (ref 34.8–46.6)
HGB: 12.4 g/dL (ref 11.6–15.9)
Immature Retic Fract: 6.2 % (ref 1.60–10.00)
MCH: 27.8 pg (ref 25.1–34.0)
MCV: 84.1 fL (ref 79.5–101.0)
MONO%: 6.2 % (ref 0.0–14.0)
NEUT%: 66.9 % (ref 38.4–76.8)

## 2012-03-31 LAB — CHCC SMEAR

## 2012-03-31 LAB — MORPHOLOGY: PLT EST: INCREASED

## 2012-03-31 LAB — IRON AND TIBC: Iron: 48 ug/dL (ref 42–145)

## 2012-03-31 NOTE — Telephone Encounter (Signed)
Gave patient appointment for 09-2012 

## 2012-03-31 NOTE — Progress Notes (Signed)
Hematology and Oncology Follow Up Visit  Carol [name] [id number] [birth year] 21 y.o. 03/31/2012 3:05 PM PCP  Principle Diagnosis: Anemia likely iron deficient  Interim History:  There have been no intercurrent illness, hospitalizations or medication changes. She has felt reasonably well and is taking her iron more or less on a regular basis  Medications: I have reviewed the patient's current medications.  Allergies:  Allergies  Allergen Reactions  . Codeine Anaphylaxis    Past Medical History, Surgical history, Social history, and Family History were reviewed and updated.  Review of Systems: Constitutional:  Negative for fever, chills, night sweats, anorexia, weight loss, pain. Cardiovascular: no chest pain or dyspnea on exertion Respiratory: negative Neurological: negative Dermatological: negative ENT: negative Skin Gastrointestinal: negative Genito-Urinary: negative Hematological and Lymphatic: negative Breast: negative Musculoskeletal: negative Remaining ROS negative.  Physical Exam: Blood pressure 119/80, pulse 112, temperature 98.9 F (37.2 C), temperature source Oral, resp. rate 20, height 4\' 11"  (1.499 m), weight 199 lb 3.2 oz (90.357 kg). ECOG:  General appearance: alert, cooperative and appears stated age Head and neck exams unremarkable, lungs are clear to auscultation and percussion. Cardiovascular examination reveals normal heart sounds no heaves thrills bruits or murmurs. Abdominal examination does not reveal any hepatomegaly or splenomegaly. Extremities are normal.  Lab Results: Lab Results  Component Value Date   WBC 11.4* 03/31/2012   HGB 12.4 03/31/2012   HCT 37.5 03/31/2012   MCV 84.1 03/31/2012   PLT 454* 03/31/2012     Chemistry      Component Value Date/Time   NA 138 03/24/2011 1006   K 4.0 03/24/2011 1006   CL 105 03/24/2011 1006   CO2 23 03/24/2011 1006   BUN 6 03/24/2011 1006   CREATININE 0.63 03/24/2011 1006      Component  Value Date/Time   CALCIUM 9.6 03/24/2011 1006   ALKPHOS 102 03/24/2011 1006   AST 13 03/24/2011 1006   ALT 16 03/24/2011 1006   BILITOT 0.2* 03/24/2011 1006      .pathology. Radiological Studies: chest X-ray n/a Mammogram n/a Bone density n/a  Impression and Plan: History of anemia with a low iron stores. She is intolerant of oral iron replacement. We discussed going ahead with IV iron infusion and she has agreed. Will schedule this for next week. I will repeat her labs and see her in about 4 months time More than 50% of the visit was spent in patient-related counselling   Pierce Crane, MD 11/22/20133:05 PM

## 2012-08-10 ENCOUNTER — Telehealth: Payer: Self-pay | Admitting: Oncology

## 2012-08-10 ENCOUNTER — Encounter: Payer: Self-pay | Admitting: Oncology

## 2012-08-10 NOTE — Telephone Encounter (Signed)
Former [facility] pt reassigned to [facility]. S/w pt's grandmother today re new provider and appt d/t. Letter mailed.

## 2012-09-29 ENCOUNTER — Other Ambulatory Visit: Payer: PRIVATE HEALTH INSURANCE | Admitting: Lab

## 2012-09-29 ENCOUNTER — Ambulatory Visit: Payer: PRIVATE HEALTH INSURANCE | Admitting: Oncology

## 2012-10-02 ENCOUNTER — Other Ambulatory Visit: Payer: Self-pay | Admitting: Oncology

## 2012-10-03 ENCOUNTER — Other Ambulatory Visit: Payer: Self-pay | Admitting: Oncology

## 2012-10-03 DIAGNOSIS — D649 Anemia, unspecified: Secondary | ICD-10-CM

## 2012-10-04 ENCOUNTER — Other Ambulatory Visit (HOSPITAL_BASED_OUTPATIENT_CLINIC_OR_DEPARTMENT_OTHER): Payer: PRIVATE HEALTH INSURANCE | Admitting: Lab

## 2012-10-04 ENCOUNTER — Encounter: Payer: Self-pay | Admitting: Oncology

## 2012-10-04 ENCOUNTER — Ambulatory Visit (HOSPITAL_BASED_OUTPATIENT_CLINIC_OR_DEPARTMENT_OTHER): Payer: PRIVATE HEALTH INSURANCE | Admitting: Oncology

## 2012-10-04 VITALS — BP 128/67 | HR 83 | Temp 98.2°F | Resp 18 | Ht 59.0 in | Wt 206.0 lb

## 2012-10-04 DIAGNOSIS — D649 Anemia, unspecified: Secondary | ICD-10-CM

## 2012-10-04 DIAGNOSIS — D509 Iron deficiency anemia, unspecified: Secondary | ICD-10-CM

## 2012-10-04 DIAGNOSIS — F17200 Nicotine dependence, unspecified, uncomplicated: Secondary | ICD-10-CM

## 2012-10-04 DIAGNOSIS — F22 Delusional disorders: Secondary | ICD-10-CM

## 2012-10-04 DIAGNOSIS — R8290 Unspecified abnormal findings in urine: Secondary | ICD-10-CM

## 2012-10-04 DIAGNOSIS — R3919 Other difficulties with micturition: Secondary | ICD-10-CM

## 2012-10-04 LAB — CBC WITH DIFFERENTIAL/PLATELET
Basophils Absolute: 0.1 10*3/uL (ref 0.0–0.1)
EOS%: 2.2 % (ref 0.0–7.0)
Eosinophils Absolute: 0.2 10*3/uL (ref 0.0–0.5)
HCT: 38.2 % (ref 34.8–46.6)
HGB: 12.7 g/dL (ref 11.6–15.9)
MONO#: 0.7 10*3/uL (ref 0.1–0.9)
NEUT#: 6.8 10*3/uL — ABNORMAL HIGH (ref 1.5–6.5)
NEUT%: 65.3 % (ref 38.4–76.8)
RDW: 14.4 % (ref 11.2–14.5)
WBC: 10.5 10*3/uL — ABNORMAL HIGH (ref 3.9–10.3)
lymph#: 2.6 10*3/uL (ref 0.9–3.3)

## 2012-10-04 LAB — COMPREHENSIVE METABOLIC PANEL (CC13)
Albumin: 3.3 g/dL — ABNORMAL LOW (ref 3.5–5.0)
Alkaline Phosphatase: 96 U/L (ref 40–150)
BUN: 8.7 mg/dL (ref 7.0–26.0)
CO2: 23 mEq/L (ref 22–29)
Glucose: 87 mg/dl (ref 70–99)
Potassium: 4 mEq/L (ref 3.5–5.1)
Sodium: 139 mEq/L (ref 136–145)
Total Bilirubin: 0.22 mg/dL (ref 0.20–1.20)
Total Protein: 7.4 g/dL (ref 6.4–8.3)

## 2012-10-04 LAB — MORPHOLOGY: PLT EST: INCREASED

## 2012-10-04 LAB — URINALYSIS, MICROSCOPIC - CHCC
Bilirubin (Urine): NEGATIVE
Glucose: NEGATIVE mg/dL
Leukocyte Esterase: NEGATIVE
Nitrite: NEGATIVE
Urobilinogen, UR: 0.2 mg/dL (ref 0.2–1)

## 2012-10-04 LAB — IRON AND TIBC
%SAT: 11 % — ABNORMAL LOW (ref 20–55)
Iron: 40 ug/dL — ABNORMAL LOW (ref 42–145)
TIBC: 352 ug/dL (ref 250–470)
UIBC: 312 ug/dL (ref 125–400)

## 2012-10-04 NOTE — Patient Instructions (Addendum)
Iron tablets that are usually easier to take are ferrous fumarate or ferrous gluconate, which are over the counter like the usual ferrous sulfate but you may have to ask the pharmacist to show you the correct ones.

## 2012-10-04 NOTE — Progress Notes (Signed)
OFFICE PROGRESS NOTE   10/04/2012   Physicians: [facility], (endocrinologist in [facility])  INTERVAL HISTORY:   Patient is seen for first time by this MD, for scheduled visit in follow up of history of iron deficiency anemia, previously followed at this office by Dr Pierce Crane since [phone] for iron deficiency anemia which had been noted 12-2010. She had IV feraheme in ~ 03-2012, disliked oral ferrous sulfate and is no longer taking this. She is followed by Thompson Caul (? which MD). Only known bleeding is with menses, which were extremely heavy prior to starting OCP ~ 3 years ago, but now are fairly light x ~ 5 days. Last iron studies at this office in 03-2012 had serum iron 48, %sat 12 and ferritin 208.   Patient has been in usual health since she was here last. She gets no regular exercise at all, tho she does housework at home. Recent thyroid tests reportedly were in good range. No recent illness. No SOB with exercise. No GERD or other GI symptoms. Occasional HA including last weekend, had been a problem with OCP previously. Hx asthma. Some chronic knee and wrist pain unchanged. Occasional visible swelling external left throat "foul odor to urine"  Remainder of 10 point Review of Systems negative.  FH: no known hematologic problems Soc: lives with grandmother (smoker), mother (smoker), brothers ages 46,16 and 58. Patient has smoked cigarettes for past year, which we have discussed now, and I have strongly encouraged her to stop.  Objective:  Vital signs in last 24 hours:  BP 128/67  Pulse 83  Temp(Src) 98.2 F (36.8 C) (Oral)  Resp 18  Ht 4\' 11"  (1.499 m)  Wt 206 lb (93.441 kg)  BMI 41.58 kg/m2  SpO2 100% Obese young lady, looks stated age, NAD. Smells of tobacco.   HEENT:PERRLA, sclera clear, anicteric, oropharynx clear, no lesions and no masses or tenderness neck LymphaticsCervical, supraclavicular, and axillary nodes normal. Resp: clear to  auscultation bilaterally and normal percussion bilaterally Cardio: regular rate and rhythm GI: soft, non-tender; bowel sounds normal; no masses,  no organomegaly Extremities: extremities normal, atraumatic, no cyanosis or edema Neuro:nonfocal Skin without rash or ecchymosis   Lab Results:  Results for orders placed in visit on 10/04/12  CBC WITH DIFFERENTIAL      Result Value Range   WBC 10.5 (*) 3.9 - 10.3 10e3/uL   NEUT# 6.8 (*) 1.5 - 6.5 10e3/uL   HGB 12.7  11.6 - 15.9 g/dL   HCT 04.5  40.9 - 81.1 %   Platelets 439 (*) 145 - 400 10e3/uL   MCV 81.2  79.5 - 101.0 fL   MCH 27.0  25.1 - 34.0 pg   MCHC 33.3  31.5 - 36.0 g/dL   RBC 9.14  7.82 - 9.56 10e6/uL   RDW 14.4  11.2 - 14.5 %   lymph# 2.6  0.9 - 3.3 10e3/uL   MONO# 0.7  0.1 - 0.9 10e3/uL   Eosinophils Absolute 0.2  0.0 - 0.5 10e3/uL   Basophils Absolute 0.1  0.0 - 0.1 10e3/uL   NEUT% 65.3  38.4 - 76.8 %   LYMPH% 25.1  14.0 - 49.7 %   MONO% 6.7  0.0 - 14.0 %   EOS% 2.2  0.0 - 7.0 %   BASO% 0.7  0.0 - 2.0 %  MORPHOLOGY      Result Value Range   Polychromasia Slight  Slight   White Cell Comments C/W auto diff  PLT EST Increased  Adequate  COMPREHENSIVE METABOLIC PANEL (CC13)      Result Value Range   Sodium 139  136 - 145 mEq/L   Potassium 4.0  3.5 - 5.1 mEq/L   Chloride 106  98 - 107 mEq/L   CO2 23  22 - 29 mEq/L   Glucose 87  70 - 99 mg/dl   BUN 8.7  7.0 - 40.9 mg/dL   Creatinine 0.7  0.6 - 1.1 mg/dL   Total Bilirubin 8.11  0.20 - 1.20 mg/dL   Alkaline Phosphatase 96  40 - 150 U/L   AST 19  5 - 34 U/L   ALT 23  0 - 55 U/L   Total Protein 7.4  6.4 - 8.3 g/dL   Albumin 3.3 (*) 3.5 - 5.0 g/dL   Calcium 9.5  8.4 - 91.4 mg/dL  URINALYSIS, MICROSCOPIC - [facility]      Result Value Range   Glucose Negative  Negative mg/dL   Bilirubin (Urine) Negative  Negative   Ketones Negative  Negative mg/dL   Specific Gravity, Urine 1.025  1.003 - 1.035   Blood Large  Negative   pH 6.0  4.6 - 8.0   Protein Negative   Negative- <30 mg/dL   Urobilinogen, UR 0.2  0.2 - 1 mg/dL   Nitrite Negative  Negative   Leukocyte Esterase Negative  Negative   RBC / HPF 3-6  0 - 2   WBC, UA 0-2  0 - 2   Bacteria, UA Many  Negative- Trace   Epithelial Cells Moderate  Negative- Few    Urine culture sent and pending Iron studies available after visit: serum iron 40, %sat 11  Studies/Results:  No results found.  Medications: I have reviewed the patient's current medications. We have discussed fact that ferrous gluconate or fumarate are often better tolerated than ferrous sulfate. With low iron again now, will be in touch with her to start Ferrous fumarate ~ 325 mg daily on empty stomach with OJ  Assessment/Plan:  1.iron deficiency anemia in 22 yo, appears related to menstrual bleeding. Begin oral ferrous fumarate as above. Will need follow up with PCP, and certainly can be seen back at this office again if needed. 2.hypothyroidism on replacement. Reportedly was evaluated by endocrinologist when that was diagnosed 3.obesity 4.tobacco abuse: discussed, and she is interested in quitting. Information given re free nicotine patches and gum, and smoking cessation classes. 5.urine symptoms, culture pending.   Reece Packer, MD   10/04/2012, 2:31 PM

## 2012-10-06 LAB — URINE CULTURE

## 2012-10-09 ENCOUNTER — Telehealth: Payer: Self-pay | Admitting: *Deleted

## 2012-10-09 NOTE — Telephone Encounter (Signed)
Faxed labs and office note from 10/04/12 to K. Mand, PA at [facility]

## 2012-10-09 NOTE — Telephone Encounter (Signed)
Message copied by Phillis Knack on Mon Oct 09, 2012  9:46 AM ------      Message from: Reece Packer      Created: Sun Oct 08, 2012  3:17 PM       Labs seen and need follow up: please let her know the urine culture does show infection. Send script for cipro 250 mg bid x 5 days. She should call PCP if any symptoms after antibiotics complete. Iron is still a little low. I would like her to try oral ferrous fumarate, which I think she would tolerate better than ferrous sulfate. Please CALL this OTC med to pharmacy so we are sure she gets correct one, ~ 325 mg daily on empty stomach with OJ. Tell her the instructions for iron please. Get name of PCP and send copy of my note from 5-28 + all labs 5-28 to that MD please.      Cc LA, TH ------

## 2012-10-09 NOTE — Telephone Encounter (Signed)
Message copied by Phillis Knack on Mon Oct 09, 2012  9:45 AM ------      Message from: Carol [name]      Created: Sun Oct 08, 2012  3:24 PM       This is second message on this patient: she needs to follow up with PCP in 4-6 months about the iron. She should stay on the iron until seen back by that MD.       Cc LA, TH ------

## 2012-10-09 NOTE — Telephone Encounter (Signed)
Called patient's number, spoke with grandmother and gave her instructions. Prescriptions called into [facility] pharmacy. Called patient and gave her these instructions. Pt verbalized understanding. PCP is Dr Loreta Ave at [facility] Will send labs and last office note to Dr Loreta Ave

## 2012-11-06 ENCOUNTER — Encounter (HOSPITAL_COMMUNITY): Payer: Self-pay | Admitting: *Deleted

## 2012-11-06 ENCOUNTER — Emergency Department (HOSPITAL_COMMUNITY)
Admission: EM | Admit: 2012-11-06 | Discharge: 2012-11-07 | Disposition: A | Discharge status: Other Acute Inpatient Hospital | Payer: PRIVATE HEALTH INSURANCE | Attending: Emergency Medicine | Admitting: Emergency Medicine

## 2012-11-06 DIAGNOSIS — F4310 Post-traumatic stress disorder, unspecified: Secondary | ICD-10-CM | POA: Insufficient documentation

## 2012-11-06 DIAGNOSIS — Z862 Personal history of diseases of the blood and blood-forming organs and certain disorders involving the immune mechanism: Secondary | ICD-10-CM | POA: Insufficient documentation

## 2012-11-06 DIAGNOSIS — Z3202 Encounter for pregnancy test, result negative: Secondary | ICD-10-CM | POA: Insufficient documentation

## 2012-11-06 DIAGNOSIS — E039 Hypothyroidism, unspecified: Secondary | ICD-10-CM | POA: Insufficient documentation

## 2012-11-06 DIAGNOSIS — Z79899 Other long term (current) drug therapy: Secondary | ICD-10-CM | POA: Insufficient documentation

## 2012-11-06 DIAGNOSIS — J45909 Unspecified asthma, uncomplicated: Secondary | ICD-10-CM | POA: Insufficient documentation

## 2012-11-06 DIAGNOSIS — F39 Unspecified mood [affective] disorder: Secondary | ICD-10-CM | POA: Insufficient documentation

## 2012-11-06 DIAGNOSIS — X838XXA Intentional self-harm by other specified means, initial encounter: Secondary | ICD-10-CM | POA: Insufficient documentation

## 2012-11-06 DIAGNOSIS — Z8719 Personal history of other diseases of the digestive system: Secondary | ICD-10-CM | POA: Insufficient documentation

## 2012-11-06 DIAGNOSIS — Z8679 Personal history of other diseases of the circulatory system: Secondary | ICD-10-CM | POA: Insufficient documentation

## 2012-11-06 DIAGNOSIS — R45851 Suicidal ideations: Secondary | ICD-10-CM | POA: Insufficient documentation

## 2012-11-06 DIAGNOSIS — F410 Panic disorder [episodic paroxysmal anxiety] without agoraphobia: Secondary | ICD-10-CM | POA: Insufficient documentation

## 2012-11-06 DIAGNOSIS — Z8742 Personal history of other diseases of the female genital tract: Secondary | ICD-10-CM | POA: Insufficient documentation

## 2012-11-06 LAB — COMPREHENSIVE METABOLIC PANEL
Albumin: 3.4 g/dL — ABNORMAL LOW (ref 3.5–5.2)
Alkaline Phosphatase: 105 U/L (ref 39–117)
BUN: 7 mg/dL (ref 6–23)
CO2: 26 mEq/L (ref 19–32)
Chloride: 100 mEq/L (ref 96–112)
Creatinine, Ser: 0.6 mg/dL (ref 0.50–1.10)
GFR calc non Af Amer: >90 mL/min (ref >90)
Glucose, Bld: 112 mg/dL — ABNORMAL HIGH (ref 70–99)
Potassium: 4.4 mEq/L (ref 3.5–5.1)
Total Bilirubin: 0.2 mg/dL — ABNORMAL LOW (ref 0.3–1.2)

## 2012-11-06 LAB — CBC
HCT: 40.1 % (ref 36.0–46.0)
Hemoglobin: 13.1 g/dL (ref 12.0–15.0)
MCV: 82 fL (ref 78.0–100.0)
RBC: 4.89 MIL/uL (ref 3.87–5.11)
RDW: 13.6 % (ref 11.5–15.5)
WBC: 14 10*3/uL — ABNORMAL HIGH (ref 4.0–10.5)

## 2012-11-06 LAB — ACETAMINOPHEN LEVEL: Acetaminophen (Tylenol), Serum: <15 ug/mL (ref 10–30)

## 2012-11-06 LAB — RAPID URINE DRUG SCREEN, HOSP PERFORMED
Barbiturates: NOT DETECTED
Benzodiazepines: NOT DETECTED

## 2012-11-06 LAB — ETHANOL: Alcohol, Ethyl (B): <11 mg/dL (ref 0–11)

## 2012-11-06 MED ORDER — PAROXETINE HCL 30 MG PO TABS
30.0000 mg | ORAL_TABLET | ORAL | Status: DC
  Administered 2012-11-07: 30 mg via ORAL
  Filled 2012-11-06 (×2): qty 1

## 2012-11-06 MED ORDER — CLONAZEPAM 0.5 MG PO TABS: 0.5000 mg | ORAL_TABLET | Freq: Two times a day (BID) | ORAL | Status: DC | PRN

## 2012-11-06 MED ORDER — NORGESTIM-ETH ESTRAD TRIPHASIC 0.18/0.215/0.25 MG-35 MCG PO TABS: 1.0000 | ORAL_TABLET | Freq: Every day | ORAL | Status: DC

## 2012-11-06 MED ORDER — LEVOTHYROXINE SODIUM 88 MCG PO TABS
88.0000 ug | ORAL_TABLET | Freq: Every day | ORAL | Status: DC
Start: 2012-11-07 — End: 2012-11-07
  Administered 2012-11-07: 88 ug via ORAL
  Filled 2012-11-06 (×2): qty 1
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
## 2012-11-07 ENCOUNTER — Inpatient Hospital Stay (HOSPITAL_COMMUNITY)
Admission: EM | Admit: 2012-11-07 | Discharge: 2012-11-13 | DRG: 885 | Disposition: A | Discharge status: Home / Self Care | Payer: PRIVATE HEALTH INSURANCE | Source: Intra-hospital | Attending: Psychiatry | Admitting: Psychiatry

## 2012-11-07 ENCOUNTER — Encounter (HOSPITAL_COMMUNITY): Payer: Self-pay | Admitting: *Deleted

## 2012-11-07 DIAGNOSIS — J45909 Unspecified asthma, uncomplicated: Secondary | ICD-10-CM | POA: Diagnosis present

## 2012-11-07 DIAGNOSIS — F4310 Post-traumatic stress disorder, unspecified: Secondary | ICD-10-CM

## 2012-11-07 DIAGNOSIS — F329 Major depressive disorder, single episode, unspecified: Secondary | ICD-10-CM | POA: Diagnosis present

## 2012-11-07 DIAGNOSIS — Z79899 Other long term (current) drug therapy: Secondary | ICD-10-CM

## 2012-11-07 DIAGNOSIS — F410 Panic disorder [episodic paroxysmal anxiety] without agoraphobia: Secondary | ICD-10-CM | POA: Diagnosis present

## 2012-11-07 DIAGNOSIS — F332 Major depressive disorder, recurrent severe without psychotic features: Secondary | ICD-10-CM

## 2012-11-07 DIAGNOSIS — E039 Hypothyroidism, unspecified: Secondary | ICD-10-CM | POA: Diagnosis present

## 2012-11-07 LAB — PREGNANCY, URINE: Preg Test, Ur: NEGATIVE

## 2012-11-07 MED ORDER — PNEUMOCOCCAL VAC POLYVALENT 25 MCG/0.5ML IJ INJ: 0.5000 mL | INJECTION | INTRAMUSCULAR | Status: AC

## 2012-11-07 NOTE — ED Provider Notes (Signed)
Accepted at [facility] under Dr. Carmelina Dane. Stable for transfer.   Richardean Canal, MD 11/07/12 [phone]

## 2012-11-07 NOTE — Tx Team (Signed)
Initial Interdisciplinary Treatment Plan  PATIENT STRENGTHS: (choose at least two) Ability for insight Active sense of humor Average or above average intelligence Communication skills General fund of knowledge Motivation for treatment/growth Physical Health Special hobby/interest Supportive family/friends  PATIENT STRESSORS: Financial difficulties Loss of death of cousin in 13-Jun-2014ex step father died 09-19-12 Marital or family conflict Traumatic event   PROBLEM LIST: Problem List/Patient Goals Date to be addressed Date deferred Reason deferred Estimated date of resolution  "I just wanna get help with anger issues and stress" 11/07/12     "Want to see the good in life, instead of the bad" 11/07/12           Increased risk for suicide 11/07/12     suicide 11/07/12     depression 11/07/12                        DISCHARGE CRITERIA:  Ability to meet basic life and health needs Adequate post-discharge living arrangements Improved stabilization in mood, thinking, and/or behavior Motivation to continue treatment in a less acute level of care Need for constant or close observation no longer present Reduction of life-threatening or endangering symptoms to within safe limits Safe-care adequate arrangements made Verbal commitment to aftercare and medication compliance  PRELIMINARY DISCHARGE PLAN: Outpatient therapy Participate in family therapy Return to previous living arrangement  PATIENT/FAMIILY INVOLVEMENT: This treatment plan has been presented to and reviewed with the patient, Carol [name], and/or family member.  The patient and family have been given the opportunity to ask questions and make suggestions.  Carol [name] [facility] 11/07/2012, 10:54 PM

## 2012-11-07 NOTE — ED Notes (Signed)
No acute distress noted at discharge 

## 2012-11-07 NOTE — BHH Counselor (Signed)
Pt accepted by Donell Sievert, PA to Dr. Oneta Rack. The room assignment is 502-2.

## 2012-11-07 NOTE — BH Assessment (Signed)
Assessment Note   Carol [name] is an 22 y.o. female with history of depression. She presents to [facility] with increased symptoms of depression. Says that her depression is triggered by the death of of her ex-step father 10-02-12 who passed away from lung cancer. She is also mourning the death of her cousin whom committed suicide June 2014, She has a suicidal plan to cut herself with a razor. 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 She started cutting 2 yrs ago stating, "I tried to kill myself by cutting 1x but it's mostly to help me feel better". Pt has a history of 3 total suicide attempts: (1) cutting wrist, (2) overdosing on klonopin, and (3) overdosing on depression medications. She is unable to contract for safety. She denies HI. She also denies current AVH's but has a history of both auditory/visual hallucinations. She sts, "In the past I would see white/black shadows" and "I would hear "whistling". Pt correlates her previous history of AVH's to her love for "ghost and psychotics". Pt denies use of drugs. She reports drinking alcohol 2x's in her lifetime.  Pt has no history of inpatient hospitalizations. She does not have a outpatient mental health provider; only a PCP-Dr. Domingo Sep.   Axis I: Major Depression, Recurrent severe Axis II: Deferred Axis III:  Past Medical History  Diagnosis Date  . Allergy   . Asthma   . Anxiety   . Thyroid disease   . Menorrhagia   . Other specified disease of hair and hair follicles   . Hypothyroid   . Migraine headache   . Panic disorder   . Anemia   . Sore throat   . Abdominal pain     right   . Constipation   . Nocturnal headaches     due to birth control pills   Axis IV: other psychosocial or environmental problems, problems related to social environment, problems with access to health care services and problems with primary support group Axis V: 31-40 impairment in reality testing  Past Medical History:  Past Medical History   Diagnosis Date  . Allergy   . Asthma   . Anxiety   . Thyroid disease   . Menorrhagia   . Other specified disease of hair and hair follicles   . Hypothyroid   . Migraine headache   . Panic disorder   . Anemia   . Sore throat   . Abdominal pain     right   . Constipation   . Nocturnal headaches     due to birth control pills    Past Surgical History  Procedure Laterality Date  . Tonsillectomy    . Cholecystectomy  03/12/11    Family History: History reviewed. No pertinent family history.  Social History:  reports that she has never smoked. She has never used smokeless tobacco. She reports that she does not drink alcohol or use illicit drugs.  Additional Social History:  Alcohol / Drug Use Pain Medications: SEE MAR Prescriptions: SEE MAR Over the Counter: SEE MAR History of alcohol / drug use?: Yes Substance #1 Name of Substance 1: Alcohol  1 - Age of First Use: n/a 1 - Amount (size/oz): n/a 1 - Frequency: n/a 1 - Duration: n/a 1 - Last Use / Amount: 22 yr old bday Pt reports drinking 1 beer last yr. She has tried alcohol 2x's in her lifetime.  No drug use reported.   CIWA: CIWA-Ar BP: 128/77 mmHg Pulse Rate: 80 COWS:  Allergies:  Allergies  Allergen Reactions  . Codeine Anaphylaxis    Home Medications:  (Not in a hospital admission)  OB/GYN Status:  No LMP recorded.  General Assessment Data Location of Assessment: [facility] Living Arrangements: Other (Comment);Other relatives;Parent (lives with mother and 3 younger brothers) Can pt return to current living arrangement?: Yes Admission Status: Voluntary Is patient capable of signing voluntary admission?: Yes Transfer from: Acute Hospital Referral Source: Self/Family/Friend     Risk to self Suicidal Ideation: Yes-Currently Present Suicidal Intent: Yes-Currently Present Is patient at risk for suicide?: Yes Suicidal Plan?: Yes-Currently Present Specify Current Suicidal Plan:  (cut self with razor  blade) Access to Means: Yes Specify Access to Suicidal Means:  ("razor blade shavings") What has been your use of drugs/alcohol within the last 12 months?:  (pt reports alcohol use on 21rst bday; 1 beer last yr) Previous Attempts/Gestures: Yes How many times?:  (3x's-pt reports OD's (2x's) and cutting wrist) Other Self Harm Risks:  (pt has a history of self mutilating-cutting) Triggers for Past Attempts: Other (Comment) (depression) Intentional Self Injurious Behavior: Cutting Comment - Self Injurious Behavior:  (pt reports self mutilating x2 yrs) Family Suicide History: No Recent stressful life event(s): Other (Comment) (step father passed away May 110th; cousin died 10-20-12) Persecutory voices/beliefs?: No Depression: No Depression Symptoms: Feeling angry/irritable;Feeling worthless/self pity;Loss of interest in usual pleasures;Fatigue;Isolating;Tearfulness Substance abuse history and/or treatment for substance abuse?: No Suicide prevention information given to non-admitted patients: Not applicable  Risk to Others Homicidal Ideation: No Thoughts of Harm to Others: No Current Homicidal Intent: No Current Homicidal Plan: No Access to Homicidal Means: No Identified Victim:  (n/a) History of harm to others?: No Assessment of Violence: None Noted Violent Behavior Description:  (patient is calm and cooperative) Does patient have access to weapons?: No Criminal Charges Pending?: No Does patient have a court date: No  Psychosis Hallucinations: None noted (pt has a history of both auditory/visual hallucinations) Delusions: None noted  Mental Status Report Appear/Hygiene: Disheveled Eye Contact: Fair Motor Activity: Freedom of movement Speech: Logical/coherent Level of Consciousness: Alert Mood: Depressed;Sad Affect: Appropriate to circumstance Anxiety Level: None Thought Processes: Coherent;Relevant Judgement: Unimpaired Orientation: Person;Place;Time;Situation Obsessive  Compulsive Thoughts/Behaviors: None  Cognitive Functioning Concentration: Decreased Memory: Remote Intact;Recent Intact IQ: Average Insight: Fair Impulse Control: Fair Appetite: Good Weight Loss:  (none reported) Weight Gain:  (approx 4 pounds) Sleep: Increased Total Hours of Sleep:  (N/A) Vegetative Symptoms: None  ADLScreening [facility] Assessment Services) Patient's cognitive ability adequate to safely complete daily activities?: Yes Patient able to express need for assistance with ADLs?: Yes Independently performs ADLs?: Yes (appropriate for developmental age)  Abuse/Neglect [facility]) Physical Abuse: Denies Verbal Abuse: Denies Sexual Abuse: Denies  Prior Inpatient Therapy Prior Inpatient Therapy: No Prior Therapy Dates:  (n/a) Prior Therapy Facilty/Provider(s):  (n/a) Reason for Treatment:  (n/a)  Prior Outpatient Therapy Prior Outpatient Therapy: No Prior Therapy Dates:  (n/a) Prior Therapy Facilty/Provider(s):  (n/a) Reason for Treatment:  (n/a)  ADL Screening (condition at time of admission) Patient's cognitive ability adequate to safely complete daily activities?: Yes Patient able to express need for assistance with ADLs?: Yes Independently performs ADLs?: Yes (appropriate for developmental age) Weakness of Legs: None  Home Assistive Devices/Equipment Home Assistive Devices/Equipment: None    Abuse/Neglect Assessment (Assessment to be complete while patient is alone) Physical Abuse: Denies Verbal Abuse: Denies Sexual Abuse: Denies Exploitation of patient/patient's resources: Denies Self-Neglect: Denies Values / Beliefs Cultural Requests During Hospitalization: None Spiritual Requests During Hospitalization: None  Advance Directives (For Healthcare) Advance Directive: Patient does not have advance directive Nutrition Screen- [facility] Adult/[facility] Patient's home diet: Regular  Additional Information 1:1 In Past 12 Months?: No CIRT Risk: No Elopement Risk:  No Does patient have medical clearance?: Yes     Disposition:  Disposition Initial Assessment Completed for this Encounter: Yes Disposition of Patient: Inpatient treatment program;Referred to (Pt accepted to [facility] )  On Site Evaluation by:   Reviewed with Physician:     Melynda Ripple [facility] 11/07/2012 12:29 PM
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
## 2012-11-08 ENCOUNTER — Encounter (HOSPITAL_COMMUNITY): Payer: Self-pay | Admitting: *Deleted

## 2012-11-08 DIAGNOSIS — F332 Major depressive disorder, recurrent severe without psychotic features: Principal | ICD-10-CM

## 2012-11-08 MED ORDER — PAROXETINE HCL 10 MG PO TABS
30.0000 mg | ORAL_TABLET | ORAL | Status: DC
  Administered 2012-11-08 – 2012-11-13 (×6): 30 mg via ORAL
  Filled 2012-11-08 (×3): qty 1
  Filled 2012-11-08: qty 3
  Filled 2012-11-08 (×4): qty 1

## 2012-11-08 MED ORDER — ALUM & MAG HYDROXIDE-SIMETH 200-200-20 MG/5ML PO SUSP: 30.0000 mL | ORAL | Status: DC | PRN

## 2012-11-08 MED ORDER — ACETAMINOPHEN 325 MG PO TABS: 650.0000 mg | ORAL_TABLET | Freq: Four times a day (QID) | ORAL | Status: DC | PRN

## 2012-11-08 MED ORDER — FERROUS GLUCONATE 324 (38 FE) MG PO TABS
324.0000 mg | ORAL_TABLET | Freq: Every day | ORAL | Status: DC
  Administered 2012-11-08 – 2012-11-13 (×6): 324 mg via ORAL
  Filled 2012-11-08 (×7): qty 1

## 2012-11-08 MED ORDER — NORGESTIM-ETH ESTRAD TRIPHASIC 0.18/0.215/0.25 MG-35 MCG PO TABS
1.0000 | ORAL_TABLET | Freq: Every day | ORAL | Status: DC
  Administered 2012-11-09 – 2012-11-13 (×5): 1 via ORAL

## 2012-11-08 MED ORDER — LEVOTHYROXINE SODIUM 88 MCG PO TABS
88.0000 ug | ORAL_TABLET | Freq: Every day | ORAL | Status: DC
  Administered 2012-11-08 – 2012-11-13 (×6): 88 ug via ORAL
  Filled 2012-11-08 (×9): qty 1

## 2012-11-08 MED ORDER — TRAZODONE HCL 50 MG PO TABS
50.0000 mg | ORAL_TABLET | Freq: Every evening | ORAL | Status: DC | PRN
  Administered 2012-11-08: 50 mg via ORAL
  Filled 2012-11-08: qty 1

## 2012-11-08 MED ORDER — MAGNESIUM HYDROXIDE 400 MG/5ML PO SUSP: 30.0000 mL | Freq: Every day | ORAL | Status: DC | PRN

## 2012-11-08 MED ORDER — HYDROXYZINE HCL 25 MG PO TABS
25.0000 mg | ORAL_TABLET | Freq: Four times a day (QID) | ORAL | Status: DC | PRN
  Administered 2012-11-08 – 2012-11-13 (×10): 25 mg via ORAL

## 2012-11-08 NOTE — Tx Team (Signed)
Interdisciplinary Treatment Plan Update  Date Reviewed: 11/08/2012   Time Reviewed: 9:45 AM   Progress in Treatment:  Attending groups: Yes  Participating in groups: Yes  Taking medication as prescribed: Yes  Tolerating medication: Yes  Family/Significant other contact made: CSW will make attempts Patient understands diagnosis: Yes  Discussing patient identified problems/goals with staff: Yes  Medical problems stabilized or resolved: Yes  Denies suicidal/homicidal ideation: Yes Patient has not harmed self or others: Yes   For review of initial/current patient goals, please see plan of care.   Estimated Length of Stay: 3-5 days  Reasons for Continued Hospitalization:  Anxiety  Depression  Medication stabilization   New Problems/Goals identified: N/A  Discharge Plan or Barriers: CSW assessing for appropriate referrals.    Additional Comments: N/A  Attendees:  Patient:    Signature:    Signature: Dr. Carmelina Dane  11/08/2012 10:22 AM   Signature: Waynetta Sandy, RN  11/08/2012 10:21 AM   Signature: Harold Barban, RN  11/08/2012 10:21 AM   Signature: Burnetta Sabin, RN 11/08/2012 10:21 AM   Signature: Juline Patch, LCSW  11/08/2012 10:21 AM   Signature: Reyes Ivan, LCSW  11/08/2012 10:21 AM   Signature: Maseta Dorley,Care Coordinator  11/08/2012 10:21 AM   Signature: Fransisca Kaufmann, [facility]  11/08/2012 10:21 AM   Signature:    Signature:    Signature:    Scribe for Treatment Team:  Reyes Ivan, LCSWA, 11/08/2012 10:21 AM

## 2012-11-08 NOTE — Progress Notes (Signed)
Adult Psychoeducational Group Note  Date:  11/08/2012 Time:  1:53 PM  Group Topic/Focus:  Personal Choices and Values:   The focus of this group is to help patients assess and explore the importance of values in their lives, how their values affect their decisions, how they express their values and what opposes their expression.  Participation Level:  Minimal  Participation Quality:  Appropriate and Attentive  Affect:  Appropriate  Cognitive:  Appropriate  Insight: Good  Engagement in Group:  Improving and Limited  Modes of Intervention:  Discussion and Exploration  Additional Comments:  Kasi wants to focus on not being so anxious and to be more outspoken and less timid. She mentioned that she takes care of her little brother because her mother works. She wants to look into staying with her grandmother when she leaves [facility].  [place] Shi K 11/08/2012, 1:53 PM

## 2012-11-08 NOTE — Progress Notes (Signed)
Adult Psychoeducational Group Note  Date:  11/08/2012 Time:  8:00PM Group Topic/Focus:  Wrap-Up Group:   The focus of this group is to help patients review their daily goal of treatment and discuss progress on daily workbooks.  Participation Level:  Active  Participation Quality:  Appropriate and Attentive  Affect:  Appropriate  Cognitive:  Alert and Appropriate  Insight: Appropriate  Engagement in Group:  Engaged  Modes of Intervention:    Additional Comments:  Pt. Was attentive and appropriate during tonight's group discussion. Pt. Stated that she had a fairly good day. Pt had a little anxious and at ease while she is here. Pt stated that she is able to open up and talk to peers and staff about what is going on.   Bing Plume D 11/08/2012, 9:08 PM
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
## 2012-11-08 NOTE — [facility] Group Notes (Signed)
[facility] LCSW Aftercare Discharge Planning Group Note   11/08/2012  8:45 AM  Participation Quality:  Alert and Appropriate   Mood/Affect:  Appropriate and Flat  Depression Rating:  5-6  Anxiety Rating:  10  Thoughts of Suicide:  Pt denies SI/HI  Will you contract for safety?   Yes  Current AVH:  Pt denies  Plan for Discharge/Comments:  Pt attended discharge planning group and actively participated in group.  CSW provided pt with today's workbook.  Pt states that she came to the hospital after referred from PCP when saying she was having SI.  Pt states that she has a stressful living environment with mother and 3 younger brothers.  Pt states that she can return home.  Pt doesn't have outpatient providers.  CSW will make appropriate referrals.  No further needs voiced by pt at this time.    Transportation Means: Pt has access to transportation  Supports: Pt names her family as supportive  Carol [name], LCSWA 11/08/2012 10:12 AM
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
## 2012-11-08 NOTE — Progress Notes (Signed)
Patient ID: Carol [name], female   DOB: 31-Jan-1991, 22 y.o.   MRN: [id number]  D:  Pt informed that her day was "ok". Pt inquired about whether or not her bc pills were dropped off. Pt was still flat and depressed. Stated that she may need something for anxiety or sleep. Informed the pt that she had meds available for both.  A:  Gave pt prn trazodone. Support and encouragement was offered. 15 min checks continued for safety.  R: Pt remains safe.

## 2012-11-08 NOTE — Progress Notes (Signed)
D: Patient appropriate and cooperative with staff and peers. Patient's affect/mood is depressed. She reported on the self inventory sheet that her sleep is fair, appetite is good, energy level is normal and ability to pay attention is improving. Patient rated depression "6" and feelings of hopelessness "4". She's attending groups and compliant with medication regimen.  A: Support and encouragement provided to patient. Scheduled medications administered per MD orders. Maintain Q15 minute checks for safety.  R: Patient receptive. Denies SI/HI/AVH. Patient remains safe.
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
## 2012-11-08 NOTE — Progress Notes (Signed)
Recreation Therapy Notes  Date: 07.02.2014 Time: 3:00pm Location: [address] Dayroom      Group Topic/Focus: Scientist, physiological, Communication  Participation Level: Did not attend  [employer], LRT/CTRS  Messiyah Waterson L 11/08/2012 4:00 PM

## 2012-11-09 DIAGNOSIS — F411 Generalized anxiety disorder: Secondary | ICD-10-CM

## 2012-11-09 MED ORDER — GABAPENTIN 100 MG PO CAPS
100.0000 mg | ORAL_CAPSULE | Freq: Three times a day (TID) | ORAL | Status: DC
  Administered 2012-11-09 – 2012-11-13 (×12): 100 mg via ORAL
  Filled 2012-11-09 (×14): qty 1

## 2012-11-09 MED ORDER — TRAZODONE HCL 50 MG PO TABS
25.0000 mg | ORAL_TABLET | Freq: Every evening | ORAL | Status: DC | PRN
  Administered 2012-11-09 – 2012-11-12 (×4): 25 mg via ORAL
  Filled 2012-11-09 (×3): qty 1
  Filled 2012-11-09: qty 2
  Filled 2012-11-09: qty 1

## 2012-11-09 MED ORDER — NICOTINE 21 MG/24HR TD PT24
21.0000 mg | MEDICATED_PATCH | Freq: Every day | TRANSDERMAL | Status: DC
  Administered 2012-11-10 – 2012-11-13 (×3): 21 mg via TRANSDERMAL
  Filled 2012-11-09 (×6): qty 1

## 2012-11-09 NOTE — Progress Notes (Signed)
Patient ID: Carol [name], female   DOB: April 05, 1991, 22 y.o.   MRN: [id number]  D: Pt endorses passive SI, but contracts for safety. Pt denies HI/AVH/Pain. Pt is pleasant and cooperative.pt states she had a nicotine fit today, but feels better now.  A: Pt was offered support and encouragement. Pt was given scheduled medications. Pt was encourage to attend groups. Q 15 minute checks were done for safety.    R:Pt attended karaoke and interacts well with peers and staff. Pt is taking medication. Pt has no complaints at this time.Pt receptive to treatment and safety maintained on unit.

## 2012-11-09 NOTE — [facility] Group Notes (Signed)
[facility] LCSW Aftercare Discharge Planning Group Note   11/09/2012  8:45 AM  Participation Quality:  Alert and Appropriate   Mood/Affect:  Appropriate, Flat and Depressed  Depression Rating:  1-2  Anxiety Rating:  0-1  Thoughts of Suicide:  Pt denies SI/HI  Will you contract for safety?   Yes  Current AVH:  Pt denies  Plan for Discharge/Comments:  Pt attended discharge planning group and actively participated in group.  CSW provided pt with today's workbook. Pt reports feeling okay today.  Pt states that she will return home to either her grandmother's or her mother's in [place].  Pt has follow up scheduled at [facility] for medication management and therapy. No further needs voiced by pt at this time.    Transportation Means: Pt has access to transportation  Supports: Pt names her family as supportive  Reyes Ivan, LCSWA 11/09/2012 10:01 AM

## 2012-11-09 NOTE — Progress Notes (Signed)
Adult Psychoeducational Group Note  Date:  11/09/2012 Time:  1:27 PM  Group Topic/Focus:  Building Self Esteem:   The Focus of this group is helping patients become aware of the effects of self-esteem on their lives, the things they and others do that enhance or undermine their self-esteem, seeing the relationship between their level of self-esteem and the choices they make and learning ways to enhance self-esteem.  Participation Level:  Active  Participation Quality:  Appropriate, Attentive, Sharing and Supportive  Affect:  Appropriate  Cognitive:  Appropriate and Oriented  Insight: Appropriate  Engagement in Group:  Engaged and Supportive  Modes of Intervention:  Discussion, Socialization and Support  Additional Comments:  The purpose of this group is to identify positive self-esteem and triggers for negative self-esteem. Pt stated that she had been bullied in school and that really brought her self-esteem down. When asked what she did that was positive that helps build her self-esteem she said she did not know. Staff offered that she has good insight and is able to provide good advice to others. Pt stated that she would work on identifying 3 or 4 things that she likes that helps build her self-esteem.  Everrett Coombe C 11/09/2012, 1:27 PM

## 2012-11-09 NOTE — [facility] Group Notes (Signed)
[facility] LCSW Group Therapy  [facility] 1:15 - 2:30 PM  11/09/2012. 12:08 PM   Type of Therapy:  Group Therapy  Participation Level:  Minimal  Participation Quality:  Attentive  Affect:  Appropriate  Cognitive:  Appropriate  Insight:  Developing/Improving   Engagement in Therapy:  Developing/Improving   Modes of Intervention:  Discussion, Education, Exploration, Problem-Solving, Rapport Building, Support   Summary of Progress/Problems:  Patient listened attentively to speaker from [facility] but made no comments on the presentation.Wynn Banker 11/09/2012  12:08 PM

## 2012-11-09 NOTE — [facility] Suicide Risk Assessment (Signed)
[facility] INPATIENT:  Family/Significant Other Suicide Prevention Education  Suicide Prevention Education:  Education Completed; Francisca December - grandmother [phone]),  (name of family member/significant other) has been identified by the patient as the family member/significant other with whom the patient will be residing, and identified as the person(s) who will aid the patient in the event of a mental health crisis (suicidal ideations/suicide attempt).  With written consent from the patient, the family member/significant other has been provided the following suicide prevention education, prior to the and/or following the discharge of the patient.  The suicide prevention education provided includes the following:  Suicide risk factors  Suicide prevention and interventions  National Suicide Hotline telephone number  [facility] assessment telephone number  [facility] Emergency Assistance 911  [facility] and/or Residential Mobile Crisis Unit telephone number  Request made of family/significant other to:  Remove weapons (e.g., guns, rifles, knives), all items previously/currently identified as safety concern.    Remove drugs/medications (over-the-counter, prescriptions, illicit drugs), all items previously/currently identified as a safety concern.  The family member/significant other verbalizes understanding of the suicide prevention education information provided.  The family member/significant other agrees to remove the items of safety concern listed above. Ms. Carol [name] states that pt is able to stay with her for a few weeks for a supportive, quieter environment.    Carol [name] 11/09/2012, 3:24 PM

## 2012-11-09 NOTE — Progress Notes (Signed)
D: Patient's affect/mood is flat and depressed. Patient complained of anxiety 9/10. She reported on the self inventory sheet that she slept well, appetite/ability to pay attention are good and energy level is low. Patient rated depression and feelings of hopelessness "7". Participating in groups and interacting with some of the peers in the milieu.  A: Support and encouragement provided to patient. Administered scheduled medications per ordering MD. PRN Vistaril given for anxiety. Monitor Q15 minute checks for safety.  R: Patient receptive. Denies SI/HI/AVH. Patient remains safe on the unit.

## 2012-11-09 NOTE — Progress Notes (Signed)
[facility] MD Progress Note  11/09/2012 1:50 PM Carol [name]  MRN:  [id number] Subjective:   Carol [name] reports feeling very anxious today requiring a prn vistaril to take. She rates her anxiety at six and depression at three. Patient reports that she has talked to her mother and grandmother and they are now more aware of her frustrations. The patient plans to live with her grandmother for a while and then get a job. She worries about how these events will play out stating "Eventually if I end back up with my mother things will just go back to normal. I love my brother but I need to progress with my own life as well." Patient reports that her family is coming to visit this evening.   Objective: Patient appears very anxious during conversation with Clinical research associate and is very restless.   Diagnosis:   Axis I: Generalized Anxiety Disorder and Major Depression, Recurrent severe Axis II: Deferred Axis III:  Past Medical History  Diagnosis Date  . Allergy   . Asthma   . Anxiety   . Thyroid disease   . Menorrhagia   . Other specified disease of hair and hair follicles   . Hypothyroid   . Migraine headache   . Panic disorder   . Anemia   . Sore throat   . Abdominal pain     right   . Constipation   . Nocturnal headaches     due to birth control pills   Axis IV: economic problems, educational problems, housing problems, other psychosocial or environmental problems and problems with primary support group Axis V: 51-60 moderate symptoms  ADL's:  Intact  Sleep: Fair  Appetite:  Good  Suicidal Ideation:  Denies Homicidal Ideation:  Denies AEB (as evidenced by):  Psychiatric Specialty Exam: Review of Systems  Constitutional: Negative.   HENT: Negative.   Eyes: Negative.   Respiratory: Negative.   Cardiovascular: Negative.   Gastrointestinal: Negative.   Genitourinary: Negative.   Musculoskeletal: Negative.   Skin: Negative.   Neurological: Negative.   Endo/Heme/Allergies: Negative.    Psychiatric/Behavioral: Positive for depression. Negative for suicidal ideas, hallucinations, memory loss and substance abuse. The patient is nervous/anxious and has insomnia.     Blood pressure 111/75, pulse 108, temperature 97.4 F (36.3 C), temperature source Oral, resp. rate 20, height 4\' 9"  (1.448 m), weight 93.441 kg (206 lb), last menstrual period 11/06/2012.Body mass index is 44.57 kg/(m^2).  General Appearance: Casual and Fairly Groomed  Patent attorney::  Good  Speech:  Clear and Coherent  Volume:  Normal  Mood:  Anxious and Dysphoric  Affect:  Flat  Thought Process:  Goal Directed and Intact  Orientation:  Full (Time, Place, and Person)  Thought Content:  WDL  Suicidal Thoughts:  No  Homicidal Thoughts:  No  Memory:  Immediate;   Good Recent;   Good Remote;   Good  Judgement:  Fair  Insight:  Shallow  Psychomotor Activity:  Restless  Concentration:  Fair  Recall:  Good  Akathisia:  No  Handed:  Right  AIMS (if indicated):     Assets:  Communication Skills Desire for Improvement Physical Health Resilience Social Support Vocational/Educational  Sleep:  Number of Hours: 6.75   Current Medications: Current Facility-Administered Medications  Medication Dose Route Frequency Provider Last Rate Last Dose  . acetaminophen (TYLENOL) tablet 650 mg  650 mg Oral Q6H PRN Kerry Hough, PA-C      . alum & mag hydroxide-simeth (MAALOX/MYLANTA) 200-200-20 MG/5ML suspension 30 mL  30 mL Oral Q4H PRN Kerry Hough, PA-C      . ferrous gluconate [facility]) tablet 324 mg  324 mg Oral Q breakfast Kerry Hough, PA-C   324 mg at 11/09/12 0749  . gabapentin (NEURONTIN) capsule 100 mg  100 mg Oral TID Carol Kaufmann, NP      . hydrOXYzine (ATARAX/VISTARIL) tablet 25 mg  25 mg Oral Q6H PRN Kerry Hough, PA-C   25 mg at 11/09/12 0750  . levothyroxine (SYNTHROID, LEVOTHROID) tablet 88 mcg  88 mcg Oral QAC breakfast Kerry Hough, PA-C   88 mcg at 11/09/12 0701  . magnesium hydroxide  (MILK OF MAGNESIA) suspension 30 mL  30 mL Oral Daily PRN Kerry Hough, PA-C      . [START ON 11/10/2012] nicotine (NICODERM CQ - dosed in mg/24 hours) patch 21 mg  21 mg Transdermal Q0600 Nehemiah Settle, MD      . Norgestimate-Ethinyl Estradiol Triphasic 0.18/0.215/0.25 MG-35 MCG tablet 1 tablet  1 tablet Oral Daily Kerry Hough, PA-C   1 tablet at 11/09/12 0749  . PARoxetine (PAXIL) tablet 30 mg  30 mg Oral BH-q7a Spencer E Simon, PA-C   30 mg at 11/09/12 0701  . traZODone (DESYREL) tablet 50 mg  50 mg Oral QHS PRN,MR X 1 Carol Kaufmann, NP   50 mg at 11/08/12 2138    Lab Results:  Results for orders placed during the hospital encounter of 11/06/12 (from the past 48 hour(s))  PREGNANCY, URINE     Status: None   Collection Time    11/07/12  5:15 PM      Result Value Range   Preg Test, Ur NEGATIVE  NEGATIVE   Comment:            THE SENSITIVITY OF THIS     METHODOLOGY IS >20 mIU/mL.    Physical Findings: AIMS: Facial and Oral Movements Muscles of Facial Expression: None, normal Lips and Perioral Area: None, normal Jaw: None, normal Tongue: None, normal,Extremity Movements Upper (arms, wrists, hands, fingers): None, normal Lower (legs, knees, ankles, toes): None, normal, Trunk Movements Neck, shoulders, hips: None, normal, Overall Severity Severity of abnormal movements (highest score from questions above): None, normal Incapacitation due to abnormal movements: None, normal Patient's awareness of abnormal movements (rate only patient's report): No Awareness,    CIWA:    COWS:     Treatment Plan Summary: Daily contact with patient to assess and evaluate symptoms and progress in treatment Medication management  Plan: Continue crisis management and stabilization.  Medication management: Reports excessive sedation from Trazodone dosage which have decreased. Ordered Neurontin 100 mg po TID to address elevated anxiety levels.  Encouraged patient to attend groups and  participate in group counseling sessions and activities.  Discharge plan in progress.  Address health issues: Vitals reviewed and stable.  Continue current treatment plan.   Medical Decision Making Problem Points:  Established problem, stable/improving (1) and Review of psycho-social stressors (1) Data Points:  Review of medication regiment & side effects (2)  I certify that inpatient services furnished can reasonably be expected to improve the patient's condition.   Abron Neddo NP-C 11/09/2012, 1:50 PM

## 2012-11-10 NOTE — Progress Notes (Signed)
Patient ID: Carol [name], female   DOB: October 25, 1990, 22 y.o.   MRN: [id number] [facility] MD Progress Note  11/10/2012 4:25 PM VIRIGINIA [name]  MRN:  [id number] Subjective:   Carol [name] is reporting an improvement in her mood since being started on Neurontin yesterday. Patient states "I am feeling more energetic and happy. I don't feel as hopeless." She reports having a good talk with her mother who visited last night. Patient states "She said that she talked to my brother about respecting my privacy more. He was just bursting in my room and disturbing me at home." Rates depression and anxiety at two. Patient is hopeful about leaving tomorrow to spend time with family. She feels that she has made progress in her treatment.   Diagnosis:   Axis I: Generalized Anxiety Disorder and Major Depression, Recurrent severe Axis II: Deferred Axis III:  Past Medical History  Diagnosis Date  . Allergy   . Asthma   . Anxiety   . Thyroid disease   . Menorrhagia   . Other specified disease of hair and hair follicles   . Hypothyroid   . Migraine headache   . Panic disorder   . Anemia   . Sore throat   . Abdominal pain     right   . Constipation   . Nocturnal headaches     due to birth control pills   Axis IV: economic problems, educational problems, housing problems, other psychosocial or environmental problems and problems with primary support group Axis V: 51-60 moderate symptoms  ADL's:  Intact  Sleep: Fair  Appetite:  Good  Suicidal Ideation:  Denies Homicidal Ideation:  Denies AEB (as evidenced by):  Psychiatric Specialty Exam: Review of Systems  Constitutional: Negative.   HENT: Negative.   Eyes: Negative.   Respiratory: Negative.   Cardiovascular: Negative.   Gastrointestinal: Negative.   Genitourinary: Negative.   Musculoskeletal: Negative.   Skin: Negative.   Neurological: Negative.   Endo/Heme/Allergies: Negative.   Psychiatric/Behavioral: Positive for depression. Negative  for suicidal ideas, hallucinations, memory loss and substance abuse. The patient is nervous/anxious and has insomnia.     Blood pressure 101/63, pulse 106, temperature 97.9 F (36.6 C), temperature source Oral, resp. rate 16, height 4\' 9"  (1.448 m), weight 93.441 kg (206 lb), last menstrual period 11/06/2012.Body mass index is 44.57 kg/(m^2).  General Appearance: Casual and Fairly Groomed  Patent attorney::  Good  Speech:  Clear and Coherent  Volume:  Normal  Mood:  Anxious and Dysphoric  Affect:  Flat  Thought Process:  Goal Directed and Intact  Orientation:  Full (Time, Place, and Person)  Thought Content:  WDL  Suicidal Thoughts:  No  Homicidal Thoughts:  No  Memory:  Immediate;   Good Recent;   Good Remote;   Good  Judgement:  Fair  Insight:  Shallow  Psychomotor Activity: WNL  Concentration:  Fair  Recall:  Good  Akathisia:  No  Handed:  Right  AIMS (if indicated):     Assets:  Communication Skills Desire for Improvement Physical Health Resilience Social Support Vocational/Educational  Sleep:  Number of Hours: 6.5   Current Medications: Current Facility-Administered Medications  Medication Dose Route Frequency Provider Last Rate Last Dose  . acetaminophen (TYLENOL) tablet 650 mg  650 mg Oral Q6H PRN Kerry Hough, PA-C      . alum & mag hydroxide-simeth (MAALOX/MYLANTA) 200-200-20 MG/5ML suspension 30 mL  30 mL Oral Q4H PRN Kerry Hough, PA-C      .  ferrous gluconate (FERGON) tablet 324 mg  324 mg Oral Q breakfast Kerry Hough, PA-C   324 mg at 11/10/12 0758  . gabapentin (NEURONTIN) capsule 100 mg  100 mg Oral TID Fransisca Kaufmann, NP   100 mg at 11/10/12 1203  . hydrOXYzine (ATARAX/VISTARIL) tablet 25 mg  25 mg Oral Q6H PRN Kerry Hough, PA-C   25 mg at 11/10/12 1622  . levothyroxine (SYNTHROID, LEVOTHROID) tablet 88 mcg  88 mcg Oral QAC breakfast Kerry Hough, PA-C   88 mcg at 11/10/12 1610  . magnesium hydroxide (MILK OF MAGNESIA) suspension 30 mL  30 mL Oral  Daily PRN Kerry Hough, PA-C      . nicotine (NICODERM CQ - dosed in mg/24 hours) patch 21 mg  21 mg Transdermal Q0600 Nehemiah Settle, MD   21 mg at 11/10/12 0655  . Norgestimate-Ethinyl Estradiol Triphasic 0.18/0.215/0.25 MG-35 MCG tablet 1 tablet  1 tablet Oral Daily Kerry Hough, PA-C   1 tablet at 11/10/12 0758  . PARoxetine (PAXIL) tablet 30 mg  30 mg Oral BH-q7a Spencer E Simon, PA-C   30 mg at 11/10/12 9604  . traZODone (DESYREL) tablet 25 mg  25 mg Oral QHS PRN,MR X 1 Fransisca Kaufmann, NP   25 mg at 11/09/12 2146    Lab Results:  No results found for this or any previous visit (from the past 48 hour(s)).  Physical Findings: AIMS: Facial and Oral Movements Muscles of Facial Expression: None, normal Lips and Perioral Area: None, normal Jaw: None, normal Tongue: None, normal,Extremity Movements Upper (arms, wrists, hands, fingers): None, normal Lower (legs, knees, ankles, toes): None, normal, Trunk Movements Neck, shoulders, hips: None, normal, Overall Severity Severity of abnormal movements (highest score from questions above): None, normal Incapacitation due to abnormal movements: None, normal Patient's awareness of abnormal movements (rate only patient's report): No Awareness,    CIWA:    COWS:     Treatment Plan Summary: Daily contact with patient to assess and evaluate symptoms and progress in treatment Medication management  Plan: Continue crisis management and stabilization.  Medication management: Reviewed with patient who states no untoward effects. Continue current regimen of Paxil and Neurontin.  Encouraged patient to attend groups and participate in group counseling sessions and activities.  Discharge plan in progress.  Address health issues: Vitals reviewed and stable.  Continue current treatment plan.   Medical Decision Making Problem Points:  Established problem, stable/improving (1) and Review of psycho-social stressors (1) Data Points:  Review  of medication regiment & side effects (2)  I certify that inpatient services furnished can reasonably be expected to improve the patient's condition.   Gwyn Mehring NP-C 11/10/2012, 4:25 PM

## 2012-11-10 NOTE — Progress Notes (Signed)
[facility] Group Notes:  (Nursing/MHT/Case Management/Adjunct)  Date:  11/10/2012  Time:  2000  Type of Therapy:  Psychoeducational Skills  Participation Level:  Active  Participation Quality:  Attentive  Affect:  Appropriate  Cognitive:  Appropriate  Insight:  Good  Engagement in Group:  Engaged  Modes of Intervention:  Education  Summary of Progress/Problems: The patient mentioned that her day went fairly well. She spent much of her time trying to distract herself from focusing on her negative thoughts. She reports that she had a good family visit. Her goal for tomorrow is to work on her discharge plans.   Hazle Coca S 11/10/2012, 11:23 PM

## 2012-11-10 NOTE — Progress Notes (Signed)
Patient ID: Carol [name], female   DOB: [birth year], 22 y.o.   MRN: [id number]  D:  Pt + VH, "see little mice"Pt denies SI/HI/AH. Pt is pleasant and cooperative. " I felt more depressed than I ever did earlier, but feel better now after taking anxiety pill"  A: Pt was offered support and encouragement. Pt was given scheduled medications. Pt was encourage to attend groups. Q 15 minute checks were done for safety.   R: Pt is taking medication. .Pt receptive to treatment and safety maintained on unit.

## 2012-11-10 NOTE — Progress Notes (Signed)
D: Patient cooperative with staff and peers. Patient's affect is appropriate to circumstance and mood is anxious. She reported on the self inventory sheet that she slept well, appetite is good, energy level is normal and ability to pay attention is improving. Patient rated depression "3" and feelings of hopelessness "0". Writer observed patient laughing and talking with peers in the dayroom and hallways.  A: Support and encouragement provided to patient. Scheduled medications administered per MD orders. Maintain Q15 minute checks for safety.  R: Patient receptive. Denies SI/HI/AVH. Patient remains safe.

## 2012-11-11 NOTE — Progress Notes (Signed)
[facility] Group Notes:  (Nursing/MHT/Case Management/Adjunct)  Date:  11/11/2012  Time:  2000  Type of Therapy:  Psychoeducational Skills  Participation Level:  Active  Participation Quality:  Appropriate  Affect:  Flat  Cognitive:  Appropriate  Insight:  Improving  Engagement in Group:  Improving  Modes of Intervention:  Education  Summary of Progress/Problems: The patient described her day as having been bad. She explained that she got upset over not being discharged today. She also explained that she was able to settle down after having a long talk with her nurse about the matter. Her goal for tomorrow is to get discharged.   Hazle Coca S 11/11/2012, 10:26 PM

## 2012-11-11 NOTE — Progress Notes (Signed)
Psychoeducational Group Note  Date: 11/11/2012 Time:  1015  Group Topic/Focus:  Identifying Needs:   The focus of this group is to help patients identify their personal needs that have been historically problematic and identify healthy behaviors to address their needs.  Participation Level:  Active  Participation Quality:  Appropriate  Affect:  Appropriate  Cognitive:  Oriented  Insight: Improving  Engagement in Group:  Engaged  Additional Comments:  Attended the group and was involved throughout the entire group  Otilio Groleau A 

## 2012-11-11 NOTE — Progress Notes (Signed)
[facility] MD Progress Note  11/11/2012 1:03 PM Carol [name]  MRN:  [id number] Subjective:  Carol [name] talked on the provider's door, anxiously wanting to talk. She reports that she asked to be discharged yesterday and the day before, and feels that she is ready to go home. She thought that she had signed a 72 hour request for discharge, but there is none in her chart. She wants to be able to followup with outpatient care. She is currently denying any suicidal or homicidal ideation. She is denying any auditory or visual hallucinations. She reports that her sleep and appetite are good. When asked about depression, she rates her depression as a 10 on a scale of 1-10 where 10 is the worst. She rates her anxiety as a 1 on a scale of 1-10.  Diagnosis:   Axis I: Generalized Anxiety Disorder and Major Depression, Recurrent severe Axis II: Deferred Axis III:  Past Medical History  Diagnosis Date  . Allergy   . Asthma   . Anxiety   . Thyroid disease   . Menorrhagia   . Other specified disease of hair and hair follicles   . Hypothyroid   . Migraine headache   . Panic disorder   . Anemia   . Sore throat   . Abdominal pain     right   . Constipation   . Nocturnal headaches     due to birth control pills    ADL's:  Intact  Sleep: Good  Appetite:  Good  Suicidal Ideation:  Patient denies any thought, plan, or intent Homicidal Ideation:  Patient denies any thought, plan, or intent AEB (as evidenced by):  Psychiatric Specialty Exam: Review of Systems  Constitutional: Negative.   HENT: Negative.   Eyes: Negative.   Respiratory: Negative.   Gastrointestinal: Negative.   Genitourinary: Negative.   Musculoskeletal: Negative.   Skin: Negative.   Neurological: Negative.   Endo/Heme/Allergies: Negative.   Psychiatric/Behavioral: Positive for depression. Negative for suicidal ideas, hallucinations and substance abuse. The patient is nervous/anxious. The patient does not have insomnia.      Blood pressure 114/78, pulse 108, temperature 98.2 F (36.8 C), temperature source Oral, resp. rate 18, height 4\' 9"  (1.448 m), weight 93.441 kg (206 lb), last menstrual period 11/06/2012.Body mass index is 44.57 kg/(m^2).  General Appearance: Casual  Eye Contact::  Fair  Speech:  Clear and Coherent  Volume:  Normal  Mood:  Anxious and Depressed  Affect:  Congruent and Tearful  Thought Process:  Goal Directed  Orientation:  Full (Time, Place, and Person)  Thought Content:  Obsessions  Suicidal Thoughts:  No  Homicidal Thoughts:  No  Memory:  Immediate;   Fair Recent;   Fair Remote;   Fair  Judgement:  Impaired  Insight:  Lacking  Psychomotor Activity:  Normal  Concentration:  Good  Recall:  Good  Akathisia:  No  Handed:  Right  AIMS (if indicated):     Assets:  Communication Skills Desire for Improvement  Sleep:  Number of Hours: 6.5   Current Medications: Current Facility-Administered Medications  Medication Dose Route Frequency Provider Last Rate Last Dose  . acetaminophen (TYLENOL) tablet 650 mg  650 mg Oral Q6H PRN Kerry Hough, PA-C      . alum & mag hydroxide-simeth (MAALOX/MYLANTA) 200-200-20 MG/5ML suspension 30 mL  30 mL Oral Q4H PRN Kerry Hough, PA-C      . ferrous gluconate (FERGON) tablet 324 mg  324 mg Oral Q breakfast Kerry Hough,  PA-C   324 mg at 11/11/12 0819  . gabapentin (NEURONTIN) capsule 100 mg  100 mg Oral TID Fransisca Kaufmann, NP   100 mg at 11/11/12 1156  . hydrOXYzine (ATARAX/VISTARIL) tablet 25 mg  25 mg Oral Q6H PRN Kerry Hough, PA-C   25 mg at 11/11/12 4098  . levothyroxine (SYNTHROID, LEVOTHROID) tablet 88 mcg  88 mcg Oral QAC breakfast Kerry Hough, PA-C   88 mcg at 11/11/12 0640  . magnesium hydroxide (MILK OF MAGNESIA) suspension 30 mL  30 mL Oral Daily PRN Kerry Hough, PA-C      . nicotine (NICODERM CQ - dosed in mg/24 hours) patch 21 mg  21 mg Transdermal Q0600 Nehemiah Settle, MD   21 mg at 11/10/12 0655  .  Norgestimate-Ethinyl Estradiol Triphasic 0.18/0.215/0.25 MG-35 MCG tablet 1 tablet  1 tablet Oral Daily Kerry Hough, PA-C   1 tablet at 11/11/12 0800  . PARoxetine (PAXIL) tablet 30 mg  30 mg Oral BH-q7a Spencer E Simon, PA-C   30 mg at 11/11/12 0640  . traZODone (DESYREL) tablet 25 mg  25 mg Oral QHS PRN,MR X 1 Fransisca Kaufmann, NP   25 mg at 11/10/12 2246    Lab Results: No results found for this or any previous visit (from the past 48 hour(s)).  Physical Findings: AIMS: Facial and Oral Movements Muscles of Facial Expression: None, normal Lips and Perioral Area: None, normal Jaw: None, normal Tongue: None, normal,Extremity Movements Upper (arms, wrists, hands, fingers): None, normal Lower (legs, knees, ankles, toes): None, normal, Trunk Movements Neck, shoulders, hips: None, normal, Overall Severity Severity of abnormal movements (highest score from questions above): None, normal Incapacitation due to abnormal movements: None, normal Patient's awareness of abnormal movements (rate only patient's report): No Awareness,    CIWA:    COWS:     Treatment Plan Summary: Daily contact with patient to assess and evaluate symptoms and progress in treatment Medication management  Plan: We will continue her current plan of care, and arrange appointments for followup care before discharge. She is encouraged to attend and participate in all groups.  Medical Decision Making Problem Points:  Established problem, stable/improving (1) and Review of psycho-social stressors (1) Data Points:  Review or order clinical lab tests (1) Review of medication regiment & side effects (2)  I certify that inpatient services furnished can reasonably be expected to improve the patient's condition.   Madeleyn Schwimmer 11/11/2012, 1:03 PM

## 2012-11-11 NOTE — [facility] Group Notes (Signed)
[facility] Group Notes:  (Clinical Social Work)  11/11/2012   3:00-4:00PM  Summary of Progress/Problems:   The main focus of today's process group was for the patient to identify something in their life that led to their hospitalization that they would like to change, then to discuss their motivation to change.  The Stages of Change were explained to the group, then each patient identified where they are in that process.  A scaling question was used with motivation interviewing to determine the patient's current motivation to change the identified behavior (1-10, low to high). The patient expressed that today has been hard for her, as she became angry earlier and really wanted to lash out.  She received positive feedback from group for restraining herself, as well as suggestions for coping with the ruminations on the past that she described.  Type of Therapy:  Process Group  Participation Level:  Active  Participation Quality:  Attentive  Affect:  Blunted and Irritable  Cognitive:  Appropriate  Insight:  Engaged  Engagement in Therapy:  Engaged  Modes of Intervention:  Education, Motivational Interviewing   Ambrose Mantle, LCSW 11/11/2012, 4:40 PM

## 2012-11-11 NOTE — Progress Notes (Signed)
Psychoeducational Group Note  Date:  12/18/2011 Time: 0930   Group Topic/Focus:  Identifying  Goals : The group is focused on  helping the patients identify goals they want to srtive for and identify strategies needed to obtain these goals.  Participation Level:  active Participation Quality: good Affect: flat Cognitive:  ggod  Insight:  good  Engagement in Group: engaged  Additional Comments:    PD RN [facility]

## 2012-11-11 NOTE — Progress Notes (Signed)
D Carol [name] says " I am ready to go home now. Carol [name] saw me yesterday and said I would go home today". This nurse tried to explain to pt about POC, treatment team meetings, DC palnning and DC follow up . This Clinical research associate explained MD's responsibilities to DC suicide risk assessemtns, DC orders as well as well as general responsibility for pt's POC and pt adamant that she has " signed a 72 hr request for DC. This Clinical research associate looked on pt's chart x2 and no signed consent signed and pt advised.   A SHe denies active SI within the past 24 hrs. SHe rated her depression and hopelessness "2/1" respectively and stated her DC plan is to " help family memebers more deal with stress.    R She is very limited in her willing ness and insight into accepting herslef and understanding her disease.

## 2012-11-11 NOTE — Progress Notes (Signed)
D.  Pt pleasant on approach, putting together puzzle in dayroom.  No complaints voiced, states that medication given to her last night worked very well for sleep and requested same tonight.  Positive for wrap up group, see group notes.  Interacting appropriately within milieu.  Denies SI/HI/hallucinations at this time.  A.  Support and encouragement offered  Medication given as requested.  R.  Pt remains safe on unit, will continue to monitor.

## 2012-11-12 NOTE — [facility] Group Notes (Signed)
[facility] Group Notes:  (Clinical Social Work)  11/12/2012   3:00-4:00PM  Summary of Progress/Problems:   The main focus of today's process group was to   identify the patient's current support system and decide on other supports that can be put in place.  The picture on workbook was used to discuss why additional supports are needed, and a hand-out was distributed with four definitions/levels of support, then used to talk about how patients have given and received all different kinds of support.  An emphasis was placed on using counselor, doctor, therapy groups, 12-step groups, and problem-specific support groups to expand supports.  The patient identified one additional support as being a willingness to try a support group and get in touch with other sides of the family that currently she is not using as supports.  She is also willing to go to church groups.  She feels her mother, grandmother, aunts, uncles, and God are very supportive, as well as her hospital roommate.  She stated that she woke up feeling great today, and that she has had no problems today, has been trying to implement what she has learned.  Type of Therapy:  Process Group  Participation Level:  Active  Participation Quality:  Attentive and Sharing  Affect:  Appropriate  Cognitive:  Appropriate and Oriented  Insight:  Engaged  Engagement in Therapy:  Engaged  Modes of Intervention:  Education,  Support and [employer], LCSW 11/12/2012, 4:26 PM

## 2012-11-12 NOTE — Progress Notes (Signed)
D.  Pt pleasant and bright on approach, hopeful for discharge tomorrow.  States that she has had a very good day, no complaints voiced.  Denies SI/HI/hallucinations at this time.  Positive for evening wrap up group.  A.  Support and encouragement offered  R.  Pt remain safe on unit, will continue to monitor.

## 2012-11-12 NOTE — Progress Notes (Signed)
Patient ID: Carol [name], female   DOB: 1990/08/05, 22 y.o.   MRN: [id number] [facility] MD Progress Note  11/12/2012 4:55 PM Carol [name]  MRN:  [id number] Subjective:  "I' was upset earlier, but I'm over it now, there was some confusion about my going home."  Objective: Patient reports having a good day, denies anxiety, depression, SI/HI. She notes that she is anxious to go home, but is unclear on her follow up plans once she leaves [facility].  She states she will live with her GM until "things settle down." Diagnosis:   Axis I: Generalized Anxiety Disorder and Major Depression, Recurrent severe Axis II: Deferred Axis III:  Past Medical History  Diagnosis Date  . Allergy   . Asthma   . Anxiety   . Thyroid disease   . Menorrhagia   . Other specified disease of hair and hair follicles   . Hypothyroid   . Migraine headache   . Panic disorder   . Anemia   . Sore throat   . Abdominal pain     right   . Constipation   . Nocturnal headaches     due to birth control pills    ADL's:  Intact  Sleep: Good  Appetite:  Good  Suicidal Ideation:  Patient denies any thought, plan, or intent Homicidal Ideation:  Patient denies any thought, plan, or intent AEB (as evidenced by):  Psychiatric Specialty Exam: Review of Systems  Constitutional: Negative.   HENT: Negative.   Eyes: Negative.   Respiratory: Negative.   Gastrointestinal: Negative.   Genitourinary: Negative.   Musculoskeletal: Negative.   Skin: Negative.   Neurological: Negative.   Endo/Heme/Allergies: Negative.   Psychiatric/Behavioral: Positive for depression. Negative for suicidal ideas, hallucinations and substance abuse. The patient is nervous/anxious. The patient does not have insomnia.     Blood pressure 117/65, pulse 88, temperature 97.6 F (36.4 C), temperature source Oral, resp. rate 17, height 4\' 9"  (1.448 m), weight 93.441 kg (206 lb), last menstrual period 11/06/2012.Body mass index is 44.57 kg/(m^2).   General Appearance: Casual  Eye Contact::  Fair  Speech:  Clear and Coherent  Volume:  Normal  Mood:  euthymic  Affect:  Congruent, pleasant and smiling.  Thought Process:  Goal Directed  Orientation:  Full (Time, Place, and Person)  Thought Content:  Obsessions  Suicidal Thoughts:  No  Homicidal Thoughts:  No  Memory:  Immediate;   Fair Recent;   Fair Remote;   Fair  Judgement:  Impaired  Insight:  Lacking  Psychomotor Activity:  Normal  Concentration:  Good  Recall:  Good  Akathisia:  No  Handed:  Right  AIMS (if indicated):     Assets:  Communication Skills Desire for Improvement  Sleep:  Number of Hours: 6.75   Current Medications: Current Facility-Administered Medications  Medication Dose Route Frequency Provider Last Rate Last Dose  . acetaminophen (TYLENOL) tablet 650 mg  650 mg Oral Q6H PRN Kerry Hough, PA-C      . alum & mag hydroxide-simeth (MAALOX/MYLANTA) 200-200-20 MG/5ML suspension 30 mL  30 mL Oral Q4H PRN Kerry Hough, PA-C      . ferrous gluconate (FERGON) tablet 324 mg  324 mg Oral Q breakfast Kerry Hough, PA-C   324 mg at 11/12/12 0801  . gabapentin (NEURONTIN) capsule 100 mg  100 mg Oral TID Fransisca Kaufmann, NP   100 mg at 11/12/12 1200  . hydrOXYzine (ATARAX/VISTARIL) tablet 25 mg  25 mg Oral Q6H PRN  Kerry Hough, PA-C   25 mg at 11/12/12 1610  . levothyroxine (SYNTHROID, LEVOTHROID) tablet 88 mcg  88 mcg Oral QAC breakfast Kerry Hough, PA-C   88 mcg at 11/12/12 9604  . magnesium hydroxide (MILK OF MAGNESIA) suspension 30 mL  30 mL Oral Daily PRN Kerry Hough, PA-C      . nicotine (NICODERM CQ - dosed in mg/24 hours) patch 21 mg  21 mg Transdermal Q0600 Nehemiah Settle, MD   21 mg at 11/12/12 0609  . Norgestimate-Ethinyl Estradiol Triphasic 0.18/0.215/0.25 MG-35 MCG tablet 1 tablet  1 tablet Oral Daily Kerry Hough, PA-C   1 tablet at 11/12/12 0800  . PARoxetine (PAXIL) tablet 30 mg  30 mg Oral BH-q7a Spencer E Simon, PA-C    30 mg at 11/12/12 5409  . traZODone (DESYREL) tablet 25 mg  25 mg Oral QHS PRN,MR X 1 Fransisca Kaufmann, NP   25 mg at 11/11/12 2201    Lab Results: No results found for this or any previous visit (from the past 48 hour(s)).  Physical Findings: AIMS: Facial and Oral Movements Muscles of Facial Expression: None, normal Lips and Perioral Area: None, normal Jaw: None, normal Tongue: None, normal,Extremity Movements Upper (arms, wrists, hands, fingers): None, normal Lower (legs, knees, ankles, toes): None, normal, Trunk Movements Neck, shoulders, hips: None, normal, Overall Severity Severity of abnormal movements (highest score from questions above): None, normal Incapacitation due to abnormal movements: None, normal Patient's awareness of abnormal movements (rate only patient's report): No Awareness,    CIWA:    COWS:     Treatment Plan Summary: Daily contact with patient to assess and evaluate symptoms and progress in treatment Medication management  Plan: We will continue her current plan of care, and arrange appointments for followup care before discharge. She is encouraged to attend and participate in all groups.  Medical Decision Making Problem Points:  Established problem, stable/improving (1) and Review of psycho-social stressors (1) Data Points:  Review or order clinical lab tests (1) Review of medication regiment & side effects (2)  I certify that inpatient services furnished can reasonably be expected to improve the patient's condition.  Rona Ravens. Marquel Spoto RPAC 4:58 PM 11/12/2012

## 2012-11-12 NOTE — Progress Notes (Signed)
Carol [name] is calmer today. She is seen  Interacting with her peers in the dayroom, she requests to be given drug handouts on her medications and this is done by this Clinical research associate. . She makes better eye contact. She attempts to joke and laugh with this Clinical research associate.   A She completes her AM self inventory and on it she writes she denies SI within the past 24 hrs, she rates her depression and hopelessness "0/0" and states her DC plan is to " go to college and get a job". \  R Safety is in place and POC moves forward.

## 2012-11-13 DIAGNOSIS — F4310 Post-traumatic stress disorder, unspecified: Secondary | ICD-10-CM

## 2012-11-13 DIAGNOSIS — F329 Major depressive disorder, single episode, unspecified: Secondary | ICD-10-CM | POA: Diagnosis present

## 2012-11-13 MED ORDER — GABAPENTIN 100 MG PO CAPS: 100.0000 mg | ORAL_CAPSULE | Freq: Three times a day (TID) | ORAL | Status: DC

## 2012-11-13 MED ORDER — HYDROXYZINE HCL 25 MG PO TABS: 25.0000 mg | ORAL_TABLET | Freq: Four times a day (QID) | ORAL | Status: DC | PRN

## 2012-11-13 MED ORDER — LEVOTHYROXINE SODIUM 88 MCG PO TABS: 88.0000 ug | ORAL_TABLET | Freq: Every day | ORAL | Status: AC

## 2012-11-13 MED ORDER — TRAZODONE 25 MG HALF TABLET: 25.0000 mg | ORAL_TABLET | Freq: Every evening | ORAL | Status: DC | PRN

## 2012-11-13 MED ORDER — PAROXETINE HCL 30 MG PO TABS: 30.0000 mg | ORAL_TABLET | ORAL | Status: AC

## 2012-11-13 MED ORDER — FERROUS GLUCONATE 324 (38 FE) MG PO TABS: 324.0000 mg | ORAL_TABLET | Freq: Every day | ORAL | Status: DC

## 2012-11-13 NOTE — Tx Team (Signed)
Interdisciplinary Treatment Plan Update (Adult)  Date: 11/13/2012  Time Reviewed:  9:45 AM  Progress in Treatment: Attending groups: Yes Participating in groups:  Yes Taking medication as prescribed:  Yes Tolerating medication:  Yes Family/Significant othe contact made: Yes Patient understands diagnosis:  Yes Discussing patient identified problems/goals with staff:  Yes Medical problems stabilized or resolved:  Yes Denies suicidal/homicidal ideation: Yes Issues/concerns per patient self-inventory:  Yes Other:  New problem(s) identified: N/A  Discharge Plan or Barriers: Pt has follow up scheduled at [facility]  for medication management and therapy.    Reason for Continuation of Hospitalization: Stable to d/c  Comments: N/A  Estimated length of stay: D/C today  For review of initial/current patient goals, please see plan of care.  Attendees: Patient: Carol [name]  11/13/2012 10:22 AM   Family:     Physician:   11/13/2012 9:46 AM   Nursing:   Chinita Greenland, RN 11/13/2012 9:46 AM   Clinical Social Worker:  Reyes Ivan, LCSWA 11/13/2012 9:46 AM   Other: Fransisca Kaufmann, NP 11/13/2012 9:46 AM   Other:  Frankey Shown, MA care coordination 11/13/2012 9:46 AM   Other:  Quintella Reichert, RN 11/13/2012 9:46 AM   Other:     Other:    Other:    Other:    Other:    Other:    Other:     Scribe for Treatment Team:   Carmina Miller, 11/13/2012 9:46 AM

## 2012-11-13 NOTE — Discharge Summary (Signed)
Physician Discharge Summary Note  Patient:  Carol [name] is an 22 y.o., female MRN:  [id number] DOB:  1991-04-06 Patient phone:  [phone] (home)  Patient address:   [address] [place] [facility] [address]   Date of Admission:  11/07/2012 Date of Discharge: 11/13/12  Discharge Diagnoses: Active Problems:   * No active hospital problems. *  Axis Diagnosis:  AXIS I: PTSD, Major Depression  AXIS II: Deferred  AXIS III:  Past Medical History   Diagnosis  Date   .  Allergy    .  Asthma    .  Anxiety    .  Thyroid disease    .  Menorrhagia    .  Other specified disease of hair and hair follicles    .  Hypothyroid    .  Migraine headache    .  Panic disorder    .  Anemia    .  Sore throat    .  Abdominal pain      right   .  Constipation    .  Nocturnal headaches      due to birth control pills    AXIS IV: other psychosocial or environmental problems  AXIS V: 61-70 mild symptoms   Level of Care:  OP  Hospital Course:   Carol [name] is an 22 y.o. female with history of depression. She presents to [facility] with increased symptoms of depression. Says that her depression is triggered by the death of of her ex-step father 09/17/2012 who passed away from lung cancer. She is also mourning the death of her cousin whom committed suicide June 2014, She has a suicidal plan to cut herself with a razor. 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 She started cutting 2 yrs ago stating, "I tried to kill myself by cutting 1x but it's mostly to help me feel better". Pt has a history of 3 total suicide attempts: (1) cutting wrist, (2) overdosing on klonopin, and (3) overdosing on depression medications. She is unable to contract for safety. She denies HI. She also denies current AVH's but has a history of both auditory/visual hallucinations. She sts, "In the past I would see white/black shadows" and "I would hear "whistling". Pt correlates her previous history of AVH's to her love for "ghost  and psychotics". Pt denies use of drugs. She reports drinking alcohol 2x's in her lifetime. Pt has no history of inpatient hospitalizations. She does not have a outpatient mental health provider; only a PCP-Dr. Domingo Sep. Patient appeared very anxious today upon assessment rating her anxiety a five after taking medication to help. She talks about becoming more aggressive towards her little brother and is worried about her worsening moods. She reports her main stressors are taking care of her four year old brother "My mother is too busy working and I am raising him. He is very difficult to control. He thinks I am his mother. Because of this I cannot go to school or get a job. I feel like my life is going nowhere." She reports that her mother uses guilt to manipulate her and that she continues to stay in the home. Patient states "I could go to live with my grandmother for a while and work on getting a job." Patient has passive SI with no plan related to her "increasing frustrations with my situation".   While a patient in this hospital, Carol [name] was enrolled in group counseling and activities as well as  received the following medication Current facility-administered medications:acetaminophen (TYLENOL) tablet 650 mg, 650 mg, Oral, Q6H PRN, Mena Goes Simon, PA-C;  alum & mag hydroxide-simeth (MAALOX/MYLANTA) 200-200-20 MG/5ML suspension 30 mL, 30 mL, Oral, Q4H PRN, Kerry Hough, PA-C;  ferrous gluconate (FERGON) tablet 324 mg, 324 mg, Oral, Q breakfast, Kerry Hough, PA-C, 324 mg at 11/13/12 0751 gabapentin (NEURONTIN) capsule 100 mg, 100 mg, Oral, TID, Fransisca Kaufmann, NP, 100 mg at 11/13/12 0751;  hydrOXYzine (ATARAX/VISTARIL) tablet 25 mg, 25 mg, Oral, Q6H PRN, Kerry Hough, PA-C, 25 mg at 11/13/12 0606;  levothyroxine (SYNTHROID, LEVOTHROID) tablet 88 mcg, 88 mcg, Oral, QAC breakfast, Kerry Hough, PA-C, 88 mcg at 11/13/12 1610;  magnesium hydroxide (MILK OF MAGNESIA) suspension 30 mL, 30 mL,  Oral, Daily PRN, Kerry Hough, PA-C nicotine (NICODERM CQ - dosed in mg/24 hours) patch 21 mg, 21 mg, Transdermal, Q0600, Nehemiah Settle, MD, 21 mg at 11/13/12 0607;  Norgestimate-Ethinyl Estradiol Triphasic 0.18/0.215/0.25 MG-35 MCG tablet 1 tablet, 1 tablet, Oral, Daily, Kerry Hough, PA-C, 1 tablet at 11/13/12 0800;  PARoxetine (PAXIL) tablet 30 mg, 30 mg, Oral, BH-q7a, Spencer E Simon, PA-C, 30 mg at 11/13/12 0606 traZODone (DESYREL) tablet 25 mg, 25 mg, Oral, QHS PRN,MR X 1, Fransisca Kaufmann, NP, 25 mg at 11/12/12 2127 The patient was started on Paxil 30 mg for symptoms of depression and anxiety. As her hospital admission progressed the patient continued to endorse increased symptoms of anxiety. Patient was then started on Neurontin 100 mg po TID to help improve mood stability. The patient then began to report feeling much more stable emotionally. She was ordered a dose of Trazodone 25 mg to help improve quality of her sleep without causing oversedation. Patient attended treatment team meeting this am and met with treatment team members. Pt symptoms, treatment plan and response to treatment discussed. Carol [name] endorsed that their symptoms have improved. Pt also stated that they are stable for discharge.  In other to control Active Problems:   * No active hospital problems. * , they will continue psychiatric care on outpatient basis. They will follow-up at  Follow-up Information   Follow up with [facility] On 11/16/2012. (Appointment scheduled at 1:30 pm with [facility] for therapy.  They will schedule you for medication management at this appointment.)    Contact information:   [address] [place], [place] [address] Phone: [phone] Fax: [phone]    .  In addition they were instructed to take all your medications as prescribed by your mental healthcare provider, to report any adverse effects and or reactions from your medicines to your outpatient  provider promptly, patient is instructed and cautioned to not engage in alcohol and or illegal drug use while on prescription medicines, in the event of worsening symptoms, patient is instructed to call the crisis hotline, 911 and or go to the nearest ED for appropriate evaluation and treatment of symptoms.   Upon discharge, patient adamantly denies suicidal, homicidal ideations, auditory, visual hallucinations and or delusional thinking. They left [facility] with all personal belongings in no apparent distress.  Consults:  See electronic record for details  Significant Diagnostic Studies:  See electronic record for details  Discharge Vitals:   Blood pressure 107/77, pulse 98, temperature 97.9 F (36.6 C), temperature source Oral, resp. rate 17, height 4\' 9"  (1.448 m), weight 93.441 kg (206 lb), last menstrual period 11/06/2012..  Mental Status Exam: See Mental Status Examination and Suicide Risk Assessment completed by Attending Physician  prior to discharge.  Discharge destination:  Home  Is patient on multiple antipsychotic therapies at discharge:  No  Has Patient had three or more failed trials of antipsychotic monotherapy by history: N/A Recommended Plan for Multiple Antipsychotic Therapies: N/A    Medication List    STOP taking these medications       clonazePAM 0.5 MG tablet  Commonly known as:  KLONOPIN      TAKE these medications     Indication   ferrous gluconate 324 MG tablet  Commonly known as:  FERGON  Take 1 tablet (324 mg total) by mouth daily with breakfast.   Indication:  Iron Deficiency     gabapentin 100 MG capsule  Commonly known as:  NEURONTIN  Take 1 capsule (100 mg total) by mouth 3 (three) times daily. For anxiety and mood control.   Indication:  Agitation     levothyroxine 88 MCG tablet  Commonly known as:  SYNTHROID, LEVOTHROID  Take 1 tablet (88 mcg total) by mouth daily.   Indication:  Underactive Thyroid     PARoxetine 30 MG tablet  Commonly known  as:  PAXIL  Take 1 tablet (30 mg total) by mouth every morning.   Indication:  Generalized Anxiety Disorder, Major Depressive Disorder     traZODone 25 mg Tabs  Commonly known as:  DESYREL  Take 0.5 tablets (25 mg total) by mouth at bedtime as needed and may repeat dose one time if needed for sleep.   Indication:  Trouble Sleeping, Major Depressive Disorder     TRI-SPRINTEC 0.18/0.215/0.25 MG-35 MCG tablet  Generic drug:  Norgestimate-Ethinyl Estradiol Triphasic  Take by mouth daily.            Follow-up Information   Follow up with [facility] On 11/16/2012. (Appointment scheduled at 1:30 pm with [facility] for therapy.  They will schedule you for medication management at this appointment.)    Contact information:   [address] [place], [place] [address] Phone: [phone] Fax: [phone]     Follow-up recommendations:   Activities: Resume typical activities Diet: Resume typical diet Tests: none Other: Follow up with outpatient provider and report any side effects to out patient prescriber. Continue to work the life style changes that could better help you manage your mood Comments:  Take all your medications as prescribed by your mental healthcare provider. Report any adverse effects and or reactions from your medicines to your outpatient provider promptly. Patient is instructed and cautioned to not engage in alcohol and or illegal drug use while on prescription medicines. In the event of worsening symptoms, patient is instructed to call the crisis hotline, 911 and or go to the nearest ED for appropriate evaluation and treatment of symptoms. Follow-up with your primary care provider for your other medical issues, concerns and or health care needs.  SignedFransisca Kaufmann NP-C 11/13/2012 10:37 AM Agree with assessment and plan Reymundo Poll. Dub Mikes, M.D.

## 2012-11-13 NOTE — Progress Notes (Signed)
[facility] Adult Case Management Discharge Plan :  Will you be returning to the same living situation after discharge: Yes,  returning home At discharge, do you have transportation home?:Yes,  access to transportation Do you have the ability to pay for your medications:Yes,  access to meds  Release of information consent forms completed and in the chart;  Patient's signature needed at discharge.  Patient to Follow up at: Follow-up Information   Follow up with [facility] On 11/16/2012. (Appointment scheduled at 1:30 pm with [facility] for therapy.  They will schedule you for medication management at this appointment.)    Contact information:   [address] [place], [place] [address] Phone: [phone] Fax: [phone]      Patient denies SI/HI:   Yes,  denies SI/HI    Safety Planning and Suicide Prevention discussed:  Yes,  discussed with pt and pt's grandmother (see suicide prevention note)  [name], [name] 11/13/2012, 12:30 PM

## 2012-11-13 NOTE — Progress Notes (Signed)
Carol [name] was discharged home with family.  Follow up information, prescriptions, follow up information, and sample medications given and patient verbalized understanding and asked appropriate questions.  Personal belongings returned to patient from locker 121.
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
## 2012-11-13 NOTE — BHH Group Notes (Signed)
[facility] LCSW Aftercare Discharge Planning Group Note   11/13/2012  8:45 AM  Participation Quality:  Alert and Appropriate   Mood/Affect:  Appropriate  Depression Rating:  0  Anxiety Rating:  0  Thoughts of Suicide:  Pt denies SI/HI  Will you contract for safety?   Yes  Current AVH:  Pt denies  Plan for Discharge/Comments:  Pt attended discharge planning group and actively participated in group.  CSW provided pt with today's workbook. Pt reports feeling stable to d/c today.  Pt states that she plans to reside with her grandmother upon d/c, for added support and a less stressful environment.  Pt has follow up scheduled at [facility] for medication management and therapy. No further needs voiced by pt at this time.    Transportation Means: Pt has access to transportation  Supports: Pt names her family as supportive  Reyes Ivan, LCSWA 11/13/2012 11:50 AM

## 2012-11-13 NOTE — Progress Notes (Signed)
Grief and Loss Group   Group members processed their feelings and experiences related to grief and loss. Group members shared helpful coping strategies in dealing with their grief and loss.   Pt discussed the impact of several significant deaths in her life, including the deaths of her ex-stepfather and her cousin. Pt described the desire to eradicate negative influences from her life, including creating healthier boundaries with her mother that would allow her to have more freedom to pursue her life dreams. Pt stated that it was important for her to make positive changes in her life relating to her relationship with her mother, which involved setting clear boundaries on her babysitting her younger brother. 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 Pt shared how her boyfriend is a positive influence in her life, and is helping her build trust and confidence in people.   Sherol Dade  Counselor Intern  Haroldine Laws

## 2012-11-16 NOTE — Progress Notes (Signed)
Patient Discharge Instructions:  After Visit Summary (AVS):   Faxed to:  11/16/12 Discharge Summary Note:   Faxed to:  11/16/12 Psychiatric Admission Assessment Note:   Faxed to:  11/16/12 Suicide Risk Assessment - Discharge Assessment:   Faxed to:  11/16/12 Faxed/Sent to the Next Level Care provider:  11/16/12 Faxed to [facility] @ [phone]  Jerelene Redden, 11/16/2012, 3:40 PM

## 2012-12-08 NOTE — Consult Note (Signed)
Reviewed the information documented and agree with the treatment plan.  Oaklynn Stierwalt,JANARDHAHA R. 12/08/2012 7:13 PM

## 2014-06-10 ENCOUNTER — Encounter (HOSPITAL_COMMUNITY): Payer: Self-pay | Admitting: *Deleted

## 2014-06-10 ENCOUNTER — Emergency Department (HOSPITAL_COMMUNITY)
Admission: EM | Admit: 2014-06-10 | Discharge: 2014-06-11 | Disposition: A | Discharge status: Other Acute Inpatient Hospital | Payer: Commercial Managed Care - PPO | Attending: Emergency Medicine | Admitting: Emergency Medicine

## 2014-06-10 DIAGNOSIS — Z8742 Personal history of other diseases of the female genital tract: Secondary | ICD-10-CM | POA: Insufficient documentation

## 2014-06-10 DIAGNOSIS — F0390 Unspecified dementia without behavioral disturbance: Secondary | ICD-10-CM | POA: Diagnosis not present

## 2014-06-10 DIAGNOSIS — G43909 Migraine, unspecified, not intractable, without status migrainosus: Secondary | ICD-10-CM | POA: Diagnosis not present

## 2014-06-10 DIAGNOSIS — Z79899 Other long term (current) drug therapy: Secondary | ICD-10-CM | POA: Diagnosis not present

## 2014-06-10 DIAGNOSIS — E669 Obesity, unspecified: Secondary | ICD-10-CM | POA: Insufficient documentation

## 2014-06-10 DIAGNOSIS — F410 Panic disorder [episodic paroxysmal anxiety] without agoraphobia: Secondary | ICD-10-CM | POA: Diagnosis not present

## 2014-06-10 DIAGNOSIS — Z3202 Encounter for pregnancy test, result negative: Secondary | ICD-10-CM | POA: Insufficient documentation

## 2014-06-10 DIAGNOSIS — F332 Major depressive disorder, recurrent severe without psychotic features: Secondary | ICD-10-CM | POA: Diagnosis not present

## 2014-06-10 DIAGNOSIS — T1491XA Suicide attempt, initial encounter: Secondary | ICD-10-CM

## 2014-06-10 DIAGNOSIS — D649 Anemia, unspecified: Secondary | ICD-10-CM | POA: Insufficient documentation

## 2014-06-10 DIAGNOSIS — T39392A Poisoning by other nonsteroidal anti-inflammatory drugs [NSAID], intentional self-harm, initial encounter: Secondary | ICD-10-CM | POA: Diagnosis present

## 2014-06-10 DIAGNOSIS — J45909 Unspecified asthma, uncomplicated: Secondary | ICD-10-CM | POA: Diagnosis not present

## 2014-06-10 DIAGNOSIS — T50901A Poisoning by unspecified drugs, medicaments and biological substances, accidental (unintentional), initial encounter: Secondary | ICD-10-CM | POA: Diagnosis present

## 2014-06-10 DIAGNOSIS — F4310 Post-traumatic stress disorder, unspecified: Secondary | ICD-10-CM | POA: Diagnosis not present

## 2014-06-10 DIAGNOSIS — T50902A Poisoning by unspecified drugs, medicaments and biological substances, intentional self-harm, initial encounter: Secondary | ICD-10-CM

## 2014-06-10 LAB — URINALYSIS, ROUTINE W REFLEX MICROSCOPIC
Glucose, UA: NEGATIVE mg/dL
Hgb urine dipstick: NEGATIVE
Ketones, ur: NEGATIVE mg/dL
NITRITE: POSITIVE — AB
PH: 6 (ref 5.0–8.0)
PROTEIN: NEGATIVE mg/dL
SPECIFIC GRAVITY, URINE: 1.037 — AB (ref 1.005–1.030)
UROBILINOGEN UA: 0.2 mg/dL (ref 0.0–1.0)

## 2014-06-10 LAB — ACETAMINOPHEN LEVEL

## 2014-06-10 LAB — RAPID URINE DRUG SCREEN, HOSP PERFORMED
Amphetamines: NOT DETECTED
BENZODIAZEPINES: NOT DETECTED
Barbiturates: NOT DETECTED
Cocaine: NOT DETECTED
Opiates: NOT DETECTED
Tetrahydrocannabinol: NOT DETECTED

## 2014-06-10 LAB — CBC WITH DIFFERENTIAL/PLATELET
BASOS PCT: 0 % (ref 0–1)
Basophils Absolute: 0 10*3/uL (ref 0.0–0.1)
EOS PCT: 5 % (ref 0–5)
Eosinophils Absolute: 0.6 10*3/uL (ref 0.0–0.7)
HEMATOCRIT: 38 % (ref 36.0–46.0)
HEMOGLOBIN: 12.1 g/dL (ref 12.0–15.0)
LYMPHS ABS: 2.3 10*3/uL (ref 0.7–4.0)
LYMPHS PCT: 17 % (ref 12–46)
MCH: 26.4 pg (ref 26.0–34.0)
MCHC: 31.8 g/dL (ref 30.0–36.0)
MCV: 82.8 fL (ref 78.0–100.0)
MONO ABS: 1 10*3/uL (ref 0.1–1.0)
MONOS PCT: 7 % (ref 3–12)
NEUTROS PCT: 71 % (ref 43–77)
Neutro Abs: 9.7 10*3/uL — ABNORMAL HIGH (ref 1.7–7.7)
Platelets: 443 10*3/uL — ABNORMAL HIGH (ref 150–400)
RBC: 4.59 MIL/uL (ref 3.87–5.11)
RDW: 14.3 % (ref 11.5–15.5)
WBC: 13.6 10*3/uL — ABNORMAL HIGH (ref 4.0–10.5)

## 2014-06-10 LAB — COMPREHENSIVE METABOLIC PANEL
ALT: 14 U/L (ref 0–35)
AST: 15 U/L (ref 0–37)
Albumin: 3.7 g/dL (ref 3.5–5.2)
Alkaline Phosphatase: 78 U/L (ref 39–117)
Anion gap: 9 (ref 5–15)
BILIRUBIN TOTAL: 0.2 mg/dL — AB (ref 0.3–1.2)
BUN: 12 mg/dL (ref 6–23)
CALCIUM: 9.3 mg/dL (ref 8.4–10.5)
CO2: 22 mmol/L (ref 19–32)
Chloride: 108 mmol/L (ref 96–112)
Creatinine, Ser: 0.92 mg/dL (ref 0.50–1.10)
GFR calc non Af Amer: 87 mL/min — ABNORMAL LOW (ref >90)
Glucose, Bld: 96 mg/dL (ref 70–99)
POTASSIUM: 4.1 mmol/L (ref 3.5–5.1)
Sodium: 139 mmol/L (ref 135–145)
Total Protein: 7.4 g/dL (ref 6.0–8.3)

## 2014-06-10 LAB — POC URINE PREG, ED: Preg Test, Ur: NEGATIVE

## 2014-06-10 LAB — SALICYLATE LEVEL: Salicylate Lvl: <4 mg/dL (ref 2.8–20.0)

## 2014-06-10 LAB — URINE MICROSCOPIC-ADD ON

## 2014-06-10 LAB — ETHANOL: Alcohol, Ethyl (B): <5 mg/dL (ref 0–9)

## 2014-06-10 MED ORDER — SODIUM CHLORIDE 0.9 % IV BOLUS (SEPSIS)
1000.0000 mL | Freq: Once | INTRAVENOUS | Status: AC
  Administered 2014-06-10: 1000 mL via INTRAVENOUS

## 2014-06-10 MED ORDER — ARIPIPRAZOLE 5 MG PO TABS
5.0000 mg | ORAL_TABLET | Freq: Every day | ORAL | Status: DC
  Filled 2014-06-10: qty 1

## 2014-06-10 MED ORDER — ZOLPIDEM TARTRATE 5 MG PO TABS: 5.0000 mg | ORAL_TABLET | Freq: Every evening | ORAL | Status: DC | PRN

## 2014-06-10 MED ORDER — ACETAMINOPHEN 325 MG PO TABS: 650.0000 mg | ORAL_TABLET | ORAL | Status: DC | PRN

## 2014-06-10 MED ORDER — LORAZEPAM 1 MG PO TABS: 1.0000 mg | ORAL_TABLET | Freq: Three times a day (TID) | ORAL | Status: DC | PRN

## 2014-06-10 MED ORDER — NORGESTIM-ETH ESTRAD TRIPHASIC 0.18/0.215/0.25 MG-35 MCG PO TABS: 1.0000 | ORAL_TABLET | Freq: Every day | ORAL | Status: DC

## 2014-06-10 MED ORDER — ARIPIPRAZOLE 15 MG PO TABS
30.0000 mg | ORAL_TABLET | Freq: Every day | ORAL | Status: DC
  Administered 2014-06-10: 30 mg via ORAL
  Filled 2014-06-10 (×2): qty 2

## 2014-06-10 MED ORDER — NICOTINE 21 MG/24HR TD PT24
21.0000 mg | MEDICATED_PATCH | Freq: Every day | TRANSDERMAL | Status: DC
  Administered 2014-06-10 – 2014-06-11 (×2): 21 mg via TRANSDERMAL
  Filled 2014-06-10 (×2): qty 1

## 2014-06-10 MED ORDER — GABAPENTIN 100 MG PO CAPS
100.0000 mg | ORAL_CAPSULE | Freq: Three times a day (TID) | ORAL | Status: DC
Start: 2014-06-10 — End: 2014-06-11
  Administered 2014-06-11: 100 mg via ORAL
  Filled 2014-06-10 (×2): qty 1

## 2014-06-10 MED ORDER — ONDANSETRON HCL 4 MG PO TABS: 4.0000 mg | ORAL_TABLET | Freq: Three times a day (TID) | ORAL | Status: DC | PRN

## 2014-06-10 MED ORDER — LEVOTHYROXINE SODIUM 88 MCG PO TABS
88.0000 ug | ORAL_TABLET | Freq: Every day | ORAL | Status: DC
  Administered 2014-06-11: 88 ug via ORAL
  Filled 2014-06-10 (×2): qty 1

## 2014-06-10 MED ORDER — ALUM & MAG HYDROXIDE-SIMETH 200-200-20 MG/5ML PO SUSP: 30.0000 mL | ORAL | Status: DC | PRN

## 2014-06-10 MED ORDER — PAROXETINE HCL 30 MG PO TABS
30.0000 mg | ORAL_TABLET | ORAL | Status: DC
  Administered 2014-06-11: 30 mg via ORAL
  Filled 2014-06-10 (×2): qty 1

## 2014-06-10 MED ORDER — LAMOTRIGINE 150 MG PO TABS
300.0000 mg | ORAL_TABLET | Freq: Every day | ORAL | Status: DC
  Administered 2014-06-10: 300 mg via ORAL
  Filled 2014-06-10 (×2): qty 2

## 2014-06-10 NOTE — ED Notes (Signed)
Patient states when she has feeling of SI, her plan is still the same. She would overdose on pills.

## 2014-06-10 NOTE — ED Notes (Signed)
Spoke with Barnett Applebaum with poison control. Provided her the requested information. She recommends an EKG and will advise provider.

## 2014-06-10 NOTE — ED Notes (Signed)
Update report given to poison control. No new interventions at this time.

## 2014-06-10 NOTE — ED Notes (Signed)
Spoke with provider, patient can be medically cleared. He states he will place orders.

## 2014-06-10 NOTE — ED Notes (Signed)
Attempted to call [facility], when primary nurses get done passing medications, one nurse will call to see if patient can go to SAPU.

## 2014-06-10 NOTE — ED Notes (Signed)
Patient is resting quietly. She is sitting on stretcher. Appears in no distress. Pt remains calm and cooperative. No needs.

## 2014-06-10 NOTE — ED Notes (Signed)
Bed: SQ58 Expected date:  Expected time:  Means of arrival:  Comments: EMS-OD

## 2014-06-10 NOTE — ED Notes (Signed)
Per EMS, pt took 25 7.5mg  tablets of meloxicam at 230PM today. Pt states she has suicidal ideation. Pt states her boyfriend broke up with her today. Pt's mom made the pt throw up 1 hour after ingesting the meloxicam.
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
## 2014-06-11 DIAGNOSIS — T1491 Suicide attempt: Secondary | ICD-10-CM

## 2014-06-11 DIAGNOSIS — F332 Major depressive disorder, recurrent severe without psychotic features: Secondary | ICD-10-CM | POA: Diagnosis present

## 2014-06-11 DIAGNOSIS — R45851 Suicidal ideations: Secondary | ICD-10-CM

## 2014-06-11 DIAGNOSIS — T1491XA Suicide attempt, initial encounter: Secondary | ICD-10-CM | POA: Diagnosis present

## 2014-06-11 DIAGNOSIS — T493X2A Poisoning by emollients, demulcents and protectants, intentional self-harm, initial encounter: Secondary | ICD-10-CM

## 2014-06-11 DIAGNOSIS — T50901A Poisoning by unspecified drugs, medicaments and biological substances, accidental (unintentional), initial encounter: Secondary | ICD-10-CM | POA: Diagnosis present

## 2014-06-11 LAB — URINE CULTURE

## 2014-06-11 NOTE — Progress Notes (Signed)
Patient is stable at discharge. Patient was given personal belongings. Patient was transported by [employer].

## 2014-06-11 NOTE — BH Assessment (Signed)
[place] Assessment Progress Note   Mary at [facility] called to report that [facility] has agreed to accept this pt to the service of Dr Dareen Piano.  The phone number for the pt's nurse to call report is [phone].  I have discussed this with the pt and she is agreeable to transfer as a voluntary patient.  [facility]y Pries will provide transportation.  Waylan Boga, NP has been notified and agrees to this decision.  Pt's nurse, Geni Bers, has also been notified.  Jalene Mullet, MA Triage Specialist 06/11/2014 @ 11:58

## 2014-06-11 NOTE — BH Assessment (Signed)
Inpt recommended. No [facility] beds at this time. Sent referrals to:   Lollie Sails, [facility] , [facility], [place], [place], [place] Triage Specialist 06/11/2014 1:53 AM

## 2014-06-11 NOTE — Consult Note (Signed)
[facility] Consult   Reason for Consult:  Intentional overdose Referring Physician:  EDP Patient Identification: Carol [name] MRN:  [id number] Principal Diagnosis: Suicide attempt Diagnosis:   Patient Active Problem List   Diagnosis Date Noted  . Severe recurrent major depression without psychotic features [F33.2] 06/11/2014    Priority: High  . Overdose [T50.901A] 06/11/2014    Priority: High  . Suicide attempt [T14.91] 06/11/2014    Priority: High  . PTSD (post-traumatic stress disorder) [F43.10] 11/13/2012  . Anemia [D64.9] 09/30/2011  . Chronic cholecystitis with calculus [K80.10] 02/11/2011    Total Time spent with patient: 45 minutes  Subjective:   Carol [name] is a 24 y.o. female patient admitted with suicide attempt via overdose.  HPI:  The patient was upset prior to admission because her boyfriend of six years broke up with him and overdose on 25 Meloxicam in a suicide attempt.  Today, she does not feel suicidal today because of all of the support from her family after the overdose.  However, Edwardine has had frequent overdoses in the past.  She lives with her mother and 3 younger brothers.  Jeriah is a patient of Dr. Candis Schatz and has been taking her medications regularly.  She feels she would benefit from a therapist. HPI Elements:   Location:  generalized. Quality:  acute. Severity:  severe. Timing:  constant. Duration:  few days. Context:  stressors.  Past Medical History:  Past Medical History  Diagnosis Date  . Allergy   . Asthma   . Anxiety   . Thyroid disease   . Menorrhagia   . Other specified disease of hair and hair follicles   . Hypothyroid   . Migraine headache   . Panic disorder   . Anemia   . Sore throat   . Abdominal pain     right   . Constipation   . Nocturnal headaches     due to birth control pills    Past Surgical History  Procedure Laterality Date  . Tonsillectomy    . Cholecystectomy  03/12/11   Family  History: No family history on file. Social History:  History  Alcohol Use No     History  Drug Use No    History   Social History  . Marital Status: Single    Spouse Name: N/A    Number of Children: N/A  . Years of Education: N/A   Social History Main Topics  . Smoking status: Current Every Day Smoker -- 1.00 packs/day for 1 years    Types: Cigarettes  . Smokeless tobacco: Never Used  . Alcohol Use: No  . Drug Use: No  . Sexual Activity: Not Currently    Birth Control/ Protection: Pill   Other Topics Concern  . None   Social History Narrative   Additional Social History:    Pain Medications: None Prescriptions: Abilify and lamictal (generics) See PTA medication list Over the Counter: Tylenol History of alcohol / drug use?: No history of alcohol / drug abuse                     Allergies:   Allergies  Allergen Reactions  . Codeine Anaphylaxis    Vitals: Blood pressure 103/66, pulse 80, temperature 97.6 F (36.4 C), temperature source Oral, resp. rate 20, SpO2 99 %.  Risk to Self: Suicidal Ideation: Yes-Currently Present Suicidal Intent: Yes-Currently Present Is patient at risk for suicide?: Yes Suicidal Plan?: Yes-Currently Present Specify Current Suicidal Plan:  Pt attempted to overdose on meloxicam. Access to Means: Yes Specify Access to Suicidal Means: Had old script that had some in it. What has been your use of drugs/alcohol within the last 12 months?: Pt denies regular use How many times?: 3 Other Self Harm Risks: None Triggers for Past Attempts: Other personal contacts (BF problems.) Intentional Self Injurious Behavior: None (Past hx of cutting.in 2013.) Risk to Others: Homicidal Ideation: No Thoughts of Harm to Others: No Current Homicidal Intent: No Current Homicidal Plan: No Access to Homicidal Means: No Identified Victim: No one History of harm to others?: No Assessment of Violence: None Noted Violent Behavior Description: None  noted. Does patient have access to weapons?: Yes (Comment) (Knifes) Criminal Charges Pending?: No Does patient have a court date: No Prior Inpatient Therapy: Prior Inpatient Therapy: Yes Prior Therapy Dates: 2014 Prior Therapy Facilty/Provider(s[facility]  Reason for Treatment: SI Prior Outpatient Therapy: Prior Outpatient Therapy: Yes Prior Therapy Dates: Aug '14 Prior Therapy Facilty/Provider(s): Dr. Candis Schatz at [facility] Reason for Treatment: psychiatric med monitoring  Current Facility-Administered Medications  Medication Dose Route Frequency Provider Last Rate Last Dose  . acetaminophen (TYLENOL) tablet 650 mg  650 mg Oral Q4H PRN Domenic Moras, PA-C      . alum & mag hydroxide-simeth (MAALOX/MYLANTA) 200-200-20 MG/5ML suspension 30 mL  30 mL Oral PRN Domenic Moras, PA-C      . ARIPiprazole (ABILIFY) tablet 30 mg  30 mg Oral QHS Domenic Moras, PA-C   30 mg at 06/10/14 2315  . gabapentin (NEURONTIN) capsule 100 mg  100 mg Oral TID Domenic Moras, PA-C   100 mg at 06/11/14 3810  . lamoTRIgine (LAMICTAL) tablet 300 mg  300 mg Oral QHS Domenic Moras, PA-C   300 mg at 06/10/14 2242  . levothyroxine (SYNTHROID, LEVOTHROID) tablet 88 mcg  88 mcg Oral QAC breakfast Domenic Moras, PA-C   88 mcg at 06/11/14 0827  . LORazepam (ATIVAN) tablet 1 mg  1 mg Oral Q8H PRN Domenic Moras, PA-C      . nicotine (NICODERM CQ - dosed in mg/24 hours) patch 21 mg  21 mg Transdermal Daily Domenic Moras, PA-C   21 mg at 06/11/14 1751  . Norgestimate-Ethinyl Estradiol Triphasic 0.18/0.215/0.25 MG-35 MCG tablet 1 tablet  1 tablet Oral Daily Domenic Moras, PA-C   1 tablet at 06/11/14 0258  . ondansetron (ZOFRAN) tablet 4 mg  4 mg Oral Q8H PRN Domenic Moras, PA-C      . PARoxetine (PAXIL) tablet 30 mg  30 mg Oral Lyndel Safe, PA-C   30 mg at 06/11/14 5277  . zolpidem (AMBIEN) tablet 5 mg  5 mg Oral QHS PRN Domenic Moras, PA-C       Current Outpatient Prescriptions  Medication Sig Dispense Refill  . ARIPiprazole (ABILIFY) 30 MG tablet Take  30 mg by mouth at bedtime.    . fenofibrate micronized (LOFIBRA) 200 MG capsule Take 200 mg by mouth daily before breakfast.    . lamoTRIgine (LAMICTAL) 150 MG tablet Take 300 mg by mouth at bedtime.    Marland Kitchen levothyroxine (SYNTHROID, LEVOTHROID) 88 MCG tablet Take 1 tablet (88 mcg total) by mouth daily.    . meloxicam (MOBIC) 7.5 MG tablet Take 7.5 mg by mouth daily.    . TRI-SPRINTEC 0.18/0.215/0.25 MG-35 MCG tablet Take 1 tablet by mouth daily.     . ARIPiprazole (ABILIFY PO) Take 1 tablet by mouth at bedtime.    . fenofibrate 160 MG tablet Take 160 mg by mouth daily.    Marland Kitchen  ferrous gluconate (FERGON) 324 MG tablet Take 1 tablet (324 mg total) by mouth daily with breakfast. (Patient not taking: Reported on 06/10/2014)  3  . gabapentin (NEURONTIN) 100 MG capsule Take 1 capsule (100 mg total) by mouth 3 (three) times daily. For anxiety and mood control. (Patient not taking: Reported on 06/10/2014) 90 capsule 0  . hydrOXYzine (ATARAX/VISTARIL) 25 MG tablet Take 1 tablet (25 mg total) by mouth every 6 (six) hours as needed for anxiety (sleep). (Patient not taking: Reported on 06/10/2014) 30 tablet 0  . PARoxetine (PAXIL) 30 MG tablet Take 1 tablet (30 mg total) by mouth every morning. (Patient not taking: Reported on 06/10/2014) 30 tablet 0  . traZODone (DESYREL) 25 mg TABS Take 0.5 tablets (25 mg total) by mouth at bedtime as needed and may repeat dose one time if needed for sleep. 60 tablet 0    Musculoskeletal: Strength & Muscle Tone: within normal limits Gait & Station: normal Patient leans: N/A  Psychiatric Specialty Exam:     Blood pressure 103/66, pulse 80, temperature 97.6 F (36.4 C), temperature source Oral, resp. rate 20, SpO2 99 %.There is no weight on file to calculate BMI.  General Appearance: Casual  Eye Contact::  Good  Speech:  Normal Rate  Volume:  Normal  Mood:  Anxious and Depressed  Affect:  Congruent  Thought Process:  Coherent  Orientation:  Full (Time, Place, and Person)   Thought Content:  Rumination  Suicidal Thoughts:  Yes.  with intent/plan  Homicidal Thoughts:  No  Memory:  Immediate;   Fair Recent;   Fair Remote;   Fair  Judgement:  Poor  Insight:  Fair  Psychomotor Activity:  Normal  Concentration:  Fair  Recall:  [employer] of Knowledge:Fair  Language: Good  Akathisia:  No  Handed:  Right  AIMS (if indicated):     Assets:  Financial Resources/Insurance Housing Intimacy Leisure Time Physical Health Resilience Social Support  ADL's:  Intact  Cognition: WNL  Sleep:      Medical Decision Making: Review of Psycho-Social Stressors (1), Review of Medication Regimen & Side Effects (2) and Review of New Medication or Change in Dosage (2)  Treatment Plan Summary: Daily contact with patient to assess and evaluate symptoms and progress in treatment, Medication management and Plan Transfer to [facility] for stabilization  Plan:  Recommend psychiatric Inpatient admission when medically cleared. Disposition: Transfer to [facility] for stabilization  Waylan Boga, [place] 06/11/2014 11:22 AM  Patient seen, evaluated and I agree with notes by Nurse Practitioner. Corena Pilgrim, MD

## 2014-06-11 NOTE — ED Notes (Signed)
Spoke with Shanon Brow from [place] control and stated they have released pt from poison control monitoring.
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
## 2014-06-11 NOTE — Progress Notes (Signed)
Report called to nurse at [facility]. [facility] called for transport.

## 2014-07-12 ENCOUNTER — Other Ambulatory Visit: Payer: Self-pay | Admitting: Otolaryngology

## 2014-07-12 ENCOUNTER — Other Ambulatory Visit (HOSPITAL_COMMUNITY)
Admission: RE | Admit: 2014-07-12 | Discharge: 2014-07-12 | Disposition: A | Discharge status: Home / Self Care | Payer: Commercial Managed Care - PPO | Source: Ambulatory Visit | Attending: Otolaryngology | Admitting: Otolaryngology

## 2014-07-12 DIAGNOSIS — R220 Localized swelling, mass and lump, head: Secondary | ICD-10-CM | POA: Insufficient documentation

## 2021-10-16 ENCOUNTER — Encounter (HOSPITAL_COMMUNITY): Payer: Self-pay

## 2021-10-16 ENCOUNTER — Encounter: Payer: Self-pay | Admitting: Oncology

## 2021-10-16 ENCOUNTER — Other Ambulatory Visit: Payer: Self-pay

## 2021-10-16 ENCOUNTER — Emergency Department (HOSPITAL_COMMUNITY): Payer: PRIVATE HEALTH INSURANCE

## 2021-10-16 ENCOUNTER — Emergency Department (HOSPITAL_COMMUNITY)
Admission: EM | Admit: 2021-10-16 | Discharge: 2021-10-16 | Disposition: A | Discharge status: Home / Self Care | Payer: Self-pay | Attending: Emergency Medicine | Admitting: Emergency Medicine

## 2021-10-16 DIAGNOSIS — Y9301 Activity, walking, marching and hiking: Secondary | ICD-10-CM | POA: Insufficient documentation

## 2021-10-16 DIAGNOSIS — W010XXA Fall on same level from slipping, tripping and stumbling without subsequent striking against object, initial encounter: Secondary | ICD-10-CM | POA: Insufficient documentation

## 2021-10-16 DIAGNOSIS — S82842A Displaced bimalleolar fracture of left lower leg, initial encounter for closed fracture: Secondary | ICD-10-CM | POA: Insufficient documentation

## 2021-10-16 MED ORDER — OXYCODONE-ACETAMINOPHEN 5-325 MG PO TABS: 1.0000 | ORAL_TABLET | Freq: Four times a day (QID) | ORAL | 0 refills | Status: DC | PRN

## 2021-10-16 MED ORDER — FENTANYL CITRATE PF 50 MCG/ML IJ SOSY
100.0000 ug | PREFILLED_SYRINGE | Freq: Once | INTRAMUSCULAR | Status: AC
  Administered 2021-10-16: 100 ug via INTRAVENOUS
  Filled 2021-10-16: qty 2

## 2021-10-16 MED ORDER — OXYCODONE-ACETAMINOPHEN 5-325 MG PO TABS
1.0000 | ORAL_TABLET | Freq: Once | ORAL | Status: AC
  Administered 2021-10-16: 1 via ORAL
  Filled 2021-10-16: qty 1

## 2021-10-16 NOTE — ED Provider Notes (Signed)
[facility] Provider Note   CSN: [id number] Arrival date & time: 10/16/21  0631     History  Chief Complaint  Patient presents with   Ankle Pain    Carol [name] is a 31 y.o. female.   Ankle Pain Patient presents with left ankle pain.  States she was walking and tripped.  Thinks she felt a pop in the ankle.  Pain in the ankle but no other place.  Did not hit her head.  Not on blood thinners.  Brought in by EMS and was not given pain medicine.     Home Medications Prior to Admission medications   Medication Sig Start Date End Date Taking? Authorizing Provider  oxyCODONE-acetaminophen (PERCOCET/ROXICET) 5-325 MG tablet Take 1 tablet by mouth every 6 (six) hours as needed for severe pain. 10/16/21  Yes Davonna Belling, MD  ARIPiprazole (ABILIFY PO) Take 1 tablet by mouth at bedtime.    [provider]  ARIPiprazole (ABILIFY) 30 MG tablet Take 30 mg by mouth at bedtime.    [provider]  fenofibrate 160 MG tablet Take 160 mg by mouth daily.    [provider]  fenofibrate micronized (LOFIBRA) 200 MG capsule Take 200 mg by mouth daily before breakfast.    [provider]  ferrous gluconate (FERGON) 324 MG tablet Take 1 tablet (324 mg total) by mouth daily with breakfast. Patient not taking: Reported on 06/10/2014 11/13/12   Niel Hummer, NP  gabapentin (NEURONTIN) 100 MG capsule Take 1 capsule (100 mg total) by mouth 3 (three) times daily. For anxiety and mood control. Patient not taking: Reported on 06/10/2014 11/13/12   Niel Hummer, NP  hydrOXYzine (ATARAX/VISTARIL) 25 MG tablet Take 1 tablet (25 mg total) by mouth every 6 (six) hours as needed for anxiety (sleep). Patient not taking: Reported on 06/10/2014 11/13/12   Niel Hummer, NP  lamoTRIgine (LAMICTAL) 150 MG tablet Take 300 mg by mouth at bedtime.    [provider]  levothyroxine (SYNTHROID, LEVOTHROID) 88 MCG tablet Take 1 tablet (88 mcg total) by mouth  daily. 11/13/12   Niel Hummer, NP  meloxicam (MOBIC) 7.5 MG tablet Take 7.5 mg by mouth daily.    [provider]  PARoxetine (PAXIL) 30 MG tablet Take 1 tablet (30 mg total) by mouth every morning. Patient not taking: Reported on 06/10/2014 11/13/12   Niel Hummer, NP  traZODone (DESYREL) 25 mg TABS Take 0.5 tablets (25 mg total) by mouth at bedtime as needed and may repeat dose one time if needed for sleep. 11/13/12   Niel Hummer, NP  TRI-SPRINTEC 0.18/0.215/0.25 MG-35 MCG tablet Take 1 tablet by mouth daily.  02/06/11   [provider]      Allergies    Codeine    Review of Systems   Review of Systems  Physical Exam Updated Vital Signs BP 115/68   Pulse 91   Temp 98.4 F (36.9 C)   Resp 17   Ht '4\' 11"'$  (1.499 m)   Wt 104.3 kg   LMP 09/19/2021   SpO2 100%   BMI 46.45 kg/m  Physical Exam Vitals reviewed.  Constitutional:      Appearance: She is obese.  Musculoskeletal:        General: Tenderness present.     Comments: Tenderness to left ankle both medial and laterally.  Sensation intact over foot.  Pulse intact.  No tenderness over proximal fibula.  Neurological:     Mental  Status: She is alert.     ED Results / Procedures / Treatments   Labs (all labs ordered are listed, but only abnormal results are displayed) Labs Reviewed - No data to display  EKG None  Radiology DG Ankle Complete Left  Result Date: 10/16/2021 CLINICAL DATA:  Status post fall rolling ankle.  Pain and bruising. EXAM: LEFT ANKLE COMPLETE - 3+ VIEW COMPARISON:  None Available. FINDINGS: There is diffuse soft tissue swelling. There is an acute, intra-articular obliquely oriented fracture involving the distal fibula. Medial malleolar fracture is also noted. The fracture fragments appear to be in near anatomic alignment. No displaced posterior malleolar fracture noted. IMPRESSION: 1. Acute fractures involve the medial malleolus and distal fibula. Fracture fragments are in near anatomic  alignment 2. Diffuse soft tissue swelling. Electronically Signed   By: Kerby Moors M.D.   On: 10/16/2021 08:00    Procedures Procedures    Medications Ordered in ED Medications  fentaNYL (SUBLIMAZE) injection 100 mcg (100 mcg Intravenous Given 10/16/21 1443)    ED Course/ Medical Decision Making/ A&P                           Medical Decision Making Problems Addressed: Bimalleolar ankle fracture, left, closed, initial encounter: acute illness or injury  Amount and/or Complexity of Data Reviewed Radiology: ordered and independent interpretation performed.  Risk Prescription drug management.   Patient with mechanical fall.  Left ankle pain.  Found to have bimalleolar fracture.  Rather well aligned.  No other apparent injury.  X-ray independently interpreted and showed the fracture.  Immobilized with nursing placing splint.  Pain medicines given.  Doubt compartment syndrome.  Will have follow-up with orthopedic surgery in about a week.        Final Clinical Impression(s) / ED Diagnoses Final diagnoses:  Bimalleolar ankle fracture, left, closed, initial encounter    Rx / DC Orders ED Discharge Orders          Ordered    oxyCODONE-acetaminophen (PERCOCET/ROXICET) 5-325 MG tablet  Every 6 hours PRN        10/16/21 0932              Davonna Belling, MD 10/16/21 0945

## 2021-10-16 NOTE — ED Notes (Signed)
Pt had questions about her home meds/prescription and wants some pain meds at this time.  Notified Dr Alvino Chapel, orders received

## 2021-10-16 NOTE — ED Notes (Signed)
Pt reports no s/s of any reaction to meds taken

## 2021-10-16 NOTE — ED Notes (Signed)
Pt states that she has no way to get home, will need transportation

## 2021-10-16 NOTE — Discharge Instructions (Signed)
Follow-up with Dr. Aline Brochure in about a week.

## 2021-10-16 NOTE — ED Notes (Signed)
Pt given pain meds- will wait a few minutes before d/c.

## 2021-10-16 NOTE — ED Triage Notes (Signed)
Pt arrived from home by [facility] after tripping and landing on her left ankle. States that when she landed she felt a pop. Ankle is swollen and tender to touch during triage.

## 2021-10-16 NOTE — ED Notes (Signed)
Splint placed, good capillary refill noted on all toes

## 2021-10-19 ENCOUNTER — Telehealth: Payer: Self-pay | Admitting: Orthopedic Surgery

## 2021-10-19 NOTE — Telephone Encounter (Signed)
Refer (if she cant come today I cant schedule the surgery tomorrow)

## 2021-10-19 NOTE — Telephone Encounter (Signed)
Ok   But that means I can t do the surgery because I ll be out of town

## 2021-10-19 NOTE — Telephone Encounter (Signed)
I relayed this message to patient - routing to Carol [name] to advise if Dr Amedeo Kinsman can see the patient or if patient is to be referred elsewhere. Thank you

## 2021-10-19 NOTE — Telephone Encounter (Signed)
Per Dr Ruthe Mannan staff message, called patient to offer appointment today; discussed 9:20am, (or other if cannot come at that time). Patient relays she does not have transportation. Also spoke with her mom - states they were not prepared, and cannot come at any time today. Ph [phone]

## 2021-10-20 ENCOUNTER — Encounter: Payer: Self-pay | Admitting: Oncology

## 2021-10-22 ENCOUNTER — Encounter: Payer: Self-pay | Admitting: Oncology

## 2021-10-22 ENCOUNTER — Encounter (HOSPITAL_COMMUNITY): Payer: Self-pay | Admitting: Orthopedic Surgery

## 2021-10-22 ENCOUNTER — Ambulatory Visit (INDEPENDENT_AMBULATORY_CARE_PROVIDER_SITE_OTHER): Payer: Self-pay | Admitting: Orthopedic Surgery

## 2021-10-22 ENCOUNTER — Encounter: Payer: Self-pay | Admitting: Orthopedic Surgery

## 2021-10-22 ENCOUNTER — Other Ambulatory Visit: Payer: Self-pay

## 2021-10-22 ENCOUNTER — Ambulatory Visit (INDEPENDENT_AMBULATORY_CARE_PROVIDER_SITE_OTHER): Payer: Self-pay

## 2021-10-22 DIAGNOSIS — S82842A Displaced bimalleolar fracture of left lower leg, initial encounter for closed fracture: Secondary | ICD-10-CM

## 2021-10-22 DIAGNOSIS — M79671 Pain in right foot: Secondary | ICD-10-CM

## 2021-10-22 MED ORDER — CEFAZOLIN SODIUM-DEXTROSE 2-4 GM/100ML-% IV SOLN
2.0000 g | INTRAVENOUS | Status: AC
  Administered 2021-10-23: 2 g via INTRAVENOUS
  Filled 2021-10-22: qty 100

## 2021-10-22 NOTE — Pre-Procedure Instructions (Signed)
    Carol [name]  10/22/2021      [id number] [place], [place] - [address] [place] [id number]: [phone]: [phone]   Your procedure is scheduled on 10/23/2021.  [facility]  Call this number if you have problems the morning of surgery:  [phone]   Remember:  Do not eat or drink after midnight.  You may drink clear liquids until 0730 .  Clear liquids allowed are:                    Water, Juice (non-citric and without pulp - diabetics please choose diet or no sugar options), Carbonated beverages - (diabetics please choose diet or no sugar options), Clear Tea, Black Coffee only (no creamer, milk or cream including half and half), Plain Jell-O only (diabetics please choose diet or no sugar options), Gatorade (diabetics please choose diet or no sugar options), and Plain Popsicles only    Take these medicines the morning of surgery with A SIP OF WATER: Synthroid, albuterol, PAXIL    Do not wear jewelry, make-up or nail polish.  Do not wear lotions, powders, or perfumes, or deodorant.  Do not shave 48 hours prior to surgery.  Men may shave face and neck.  Do not bring valuables to the hospital.  [facility] any belongings or [facility]ables.  Contacts, dentures or bridgework may not be worn into surgery.  Leave your suitcase in the car.  After surgery it may be brought to your room.  For patients admitted to the hospital, discharge time will be determined by your treatment team.  Patients discharged the day of surgery will not be allowed to drive home.   Name and phone number of your driver:   WGuadalupe [phone] instructions:    Please read over the following fact sheets that you were given.

## 2021-10-22 NOTE — Progress Notes (Signed)
Office Visit Note   Patient: Carol [name]           Date of Birth: [birth year]           MRN: [id number] Visit Date: 10/22/2021              Requested by: No referring provider defined for this encounter. PCP: System, Provider Not In  Chief Complaint  Patient presents with   Left Ankle - Pain    ER 10/16/2021 bimal ankle fx s/p fall   Right Foot - Pain      HPI: Patient is a 31 year old woman who is seen for initial evaluation for injury to both lower extremities.  Patient states that she had a ground-level fall and went to the emergency room on the ninth status post a trip and fall.  Radiographs showed a bimalleolar ankle fracture.  She is placed in a sugar-tong splint.  Patient also complains of lateral right foot and ankle pain.  Patient states there is no way for her to elevate her foot at home.  Past medical history positive for tobacco she states she is not taking the Neurontin anymore.  Assessment & Plan: Visit Diagnoses:  1. Pain in right foot   2. Bimalleolar ankle fracture, left, closed, initial encounter     Plan: With the displaced bimalleolar left ankle fracture have recommended proceeding with open reduction internal fixation.  Risk and benefits were discussed including infection neurovascular injury nonhealing the skin nonhealing the bone need for additional surgery.  Discussed the increased risk of infection with smoking and I have recommended smoking cessation.  Discussed with the infection she is at risk of amputation of the foot.  Patient states she understands wished to proceed at this time.  Follow-Up Instructions: Return in about 1 week (around 10/29/2021).   Ortho Exam  Patient is alert, oriented, no adenopathy, well-dressed, normal affect, normal respiratory effort. Examination patient has a palpable pulse bilaterally she has swelling in both lower extremities examination of the right foot she has ecchymosis and bruising over the anterior talofibular  ligament she is tender to palpation over this location the midfoot and forefoot are nontender to palpation.  Examination the left foot she has fracture blisters dorsally over the ankle but there are no open blisters medially or laterally she has ecchymosis and bruising from her foot being dependent.  Review of the radiographs shows a shortened and displaced Weber B fibular fracture with a completely displaced medial malleolar fracture.  No displaced posterior malleolar fracture.  No widening of the syndesmosis.  Imaging: XR Foot 2 Views Right  Result Date: 10/22/2021 2 view radiographs of the right foot shows no displacement across the midfoot no fractures.  No images are attached to the encounter.  Labs: Lab Results  Component Value Date   REPTSTATUS 06/11/2014 FINAL 06/10/2014   CULT  06/10/2014    Multiple bacterial morphotypes present, none predominant. Suggest appropriate recollection if clinically indicated. Performed at [facility] Results  Component Value Date   ALBUMIN 3.7 06/10/2014   ALBUMIN 3.4 (L) 11/06/2012   ALBUMIN 3.3 (L) 10/04/2012    No results found for: "MG" No results found for: "VD25OH"  No results found for: "PREALBUMIN"    Latest Ref Rng & Units 06/10/2014    5:51 PM 11/06/2012    3:26 PM 10/04/2012   12:57 PM  CBC EXTENDED  WBC 4.0 - 10.5 K/uL 13.6  14.0  10.5  RBC 3.87 - 5.11 MIL/uL 4.59  4.89  4.71   Hemoglobin 12.0 - 15.0 g/dL 12.1  13.1  12.7   HCT 36.0 - 46.0 % 38.0  40.1  38.2   Platelets 150 - 400 K/uL 443  457  439   NEUT# 1.7 - 7.7 K/uL 9.7   6.8   Lymph# 0.7 - 4.0 K/uL 2.3   2.6      There is no height or weight on file to calculate BMI.  Orders:  Orders Placed This Encounter  Procedures   XR Foot 2 Views Right   No orders of the defined types were placed in this encounter.    Procedures: No procedures performed  Clinical Data: No additional findings.  ROS:  All other systems negative, except as noted  in the HPI. Review of Systems  Objective: Vital Signs: LMP 09/19/2021   Specialty Comments:  No specialty comments available.  PMFS History: Patient Active Problem List   Diagnosis Date Noted   Severe recurrent major depression without psychotic features ([facility]) 06/11/2014   Overdose 06/11/2014   Suicide attempt ([place]) 06/11/2014   PTSD (post-traumatic stress disorder) 11/13/2012   Anemia 09/30/2011   Chronic cholecystitis with calculus 02/11/2011   Past Medical History:  Diagnosis Date   Abdominal pain    right    Allergy    Anemia    Anxiety    Asthma    Constipation    Hypothyroid    Menorrhagia    Migraine headache    Nocturnal headaches    due to birth control pills   Other specified disease of hair and hair follicles    Panic disorder    Sore throat    Thyroid disease     History reviewed. No pertinent family history.  Past Surgical History:  Procedure Laterality Date   CHOLECYSTECTOMY  03/12/11   TONSILLECTOMY     Social History   Occupational History   Not on file  Tobacco Use   Smoking status: Every Day    Packs/day: 1.00    Years: 1.00    Total pack years: 1.00    Types: Cigarettes   Smokeless tobacco: Never  Substance and Sexual Activity   Alcohol use: No   Drug use: No   Sexual activity: Not Currently    Birth control/protection: Pill

## 2021-10-23 ENCOUNTER — Encounter (HOSPITAL_COMMUNITY): Payer: Self-pay | Admitting: Orthopedic Surgery

## 2021-10-23 ENCOUNTER — Ambulatory Visit (HOSPITAL_BASED_OUTPATIENT_CLINIC_OR_DEPARTMENT_OTHER): Payer: Self-pay | Admitting: Anesthesiology

## 2021-10-23 ENCOUNTER — Other Ambulatory Visit: Payer: Self-pay

## 2021-10-23 ENCOUNTER — Ambulatory Visit (HOSPITAL_COMMUNITY)
Admission: RE | Admit: 2021-10-23 | Discharge: 2021-10-23 | Disposition: A | Discharge status: Home / Self Care | Payer: Self-pay | Attending: Orthopedic Surgery | Admitting: Orthopedic Surgery

## 2021-10-23 ENCOUNTER — Ambulatory Visit (HOSPITAL_COMMUNITY): Payer: Self-pay | Admitting: Anesthesiology

## 2021-10-23 ENCOUNTER — Encounter (HOSPITAL_COMMUNITY)
Admission: RE | Disposition: A | Discharge status: Home / Self Care | Payer: Self-pay | Source: Home / Self Care | Attending: Orthopedic Surgery

## 2021-10-23 DIAGNOSIS — J45909 Unspecified asthma, uncomplicated: Secondary | ICD-10-CM | POA: Insufficient documentation

## 2021-10-23 DIAGNOSIS — E039 Hypothyroidism, unspecified: Secondary | ICD-10-CM

## 2021-10-23 DIAGNOSIS — Z6841 Body Mass Index (BMI) 40.0 and over, adult: Secondary | ICD-10-CM | POA: Insufficient documentation

## 2021-10-23 DIAGNOSIS — F419 Anxiety disorder, unspecified: Secondary | ICD-10-CM | POA: Insufficient documentation

## 2021-10-23 DIAGNOSIS — W010XXA Fall on same level from slipping, tripping and stumbling without subsequent striking against object, initial encounter: Secondary | ICD-10-CM | POA: Insufficient documentation

## 2021-10-23 DIAGNOSIS — S82842A Displaced bimalleolar fracture of left lower leg, initial encounter for closed fracture: Secondary | ICD-10-CM | POA: Insufficient documentation

## 2021-10-23 DIAGNOSIS — F17210 Nicotine dependence, cigarettes, uncomplicated: Secondary | ICD-10-CM | POA: Insufficient documentation

## 2021-10-23 DIAGNOSIS — F319 Bipolar disorder, unspecified: Secondary | ICD-10-CM | POA: Insufficient documentation

## 2021-10-23 HISTORY — DX: Bipolar disorder, unspecified: F31.9

## 2021-10-23 HISTORY — DX: Depression, unspecified: F32.A

## 2021-10-23 HISTORY — PX: ORIF ANKLE FRACTURE: SHX5408

## 2021-10-23 LAB — POCT I-STAT, CHEM 8
BUN: 14 mg/dL (ref 6–20)
Calcium, Ion: 1.13 mmol/L — ABNORMAL LOW (ref 1.15–1.40)
Chloride: 108 mmol/L (ref 98–111)
Creatinine, Ser: 0.6 mg/dL (ref 0.44–1.00)
Glucose, Bld: 123 mg/dL — ABNORMAL HIGH (ref 70–99)
HCT: 31 % — ABNORMAL LOW (ref 36.0–46.0)
Hemoglobin: 10.5 g/dL — ABNORMAL LOW (ref 12.0–15.0)
Potassium: 4.4 mmol/L (ref 3.5–5.1)
Sodium: 140 mmol/L (ref 135–145)
TCO2: 21 mmol/L — ABNORMAL LOW (ref 22–32)

## 2021-10-23 LAB — SURGICAL PCR SCREEN
MRSA, PCR: NEGATIVE
Staphylococcus aureus: POSITIVE — AB

## 2021-10-23 LAB — POCT PREGNANCY, URINE: Preg Test, Ur: NEGATIVE

## 2021-10-23 SURGERY — OPEN REDUCTION INTERNAL FIXATION (ORIF) ANKLE FRACTURE
Anesthesia: Regional | Site: Ankle | Laterality: Left

## 2021-10-23 MED ORDER — LIDOCAINE 2% (20 MG/ML) 5 ML SYRINGE
INTRAMUSCULAR | Status: DC | PRN
  Administered 2021-10-23: 20 mg via INTRAVENOUS

## 2021-10-23 MED ORDER — FENTANYL CITRATE (PF) 100 MCG/2ML IJ SOLN: 50.0000 ug | Freq: Once | INTRAMUSCULAR | Status: AC

## 2021-10-23 MED ORDER — MIDAZOLAM HCL 2 MG/2ML IJ SOLN: 2.0000 mg | Freq: Once | INTRAMUSCULAR | Status: AC

## 2021-10-23 MED ORDER — LACTATED RINGERS IV SOLN: INTRAVENOUS | Status: DC

## 2021-10-23 MED ORDER — PROPOFOL 10 MG/ML IV BOLUS
INTRAVENOUS | Status: DC | PRN
  Administered 2021-10-23: 200 mg via INTRAVENOUS
  Administered 2021-10-23: 100 mg via INTRAVENOUS
  Administered 2021-10-23 (×2): 50 mg via INTRAVENOUS

## 2021-10-23 MED ORDER — MIDAZOLAM HCL 2 MG/2ML IJ SOLN
INTRAMUSCULAR | Status: AC
  Administered 2021-10-23: 2 mg via INTRAVENOUS
  Filled 2021-10-23: qty 2

## 2021-10-23 MED ORDER — DEXAMETHASONE SODIUM PHOSPHATE 10 MG/ML IJ SOLN
INTRAMUSCULAR | Status: DC | PRN
  Administered 2021-10-23: 10 mg via INTRAVENOUS

## 2021-10-23 MED ORDER — ONDANSETRON HCL 4 MG/2ML IJ SOLN
INTRAMUSCULAR | Status: AC
  Filled 2021-10-23: qty 2

## 2021-10-23 MED ORDER — ROCURONIUM BROMIDE 10 MG/ML (PF) SYRINGE
PREFILLED_SYRINGE | INTRAVENOUS | Status: DC | PRN
  Administered 2021-10-23: 80 mg via INTRAVENOUS

## 2021-10-23 MED ORDER — ALBUTEROL SULFATE HFA 108 (90 BASE) MCG/ACT IN AERS
INHALATION_SPRAY | RESPIRATORY_TRACT | Status: AC
  Filled 2021-10-23: qty 6.7

## 2021-10-23 MED ORDER — FENTANYL CITRATE (PF) 100 MCG/2ML IJ SOLN: 25.0000 ug | INTRAMUSCULAR | Status: DC | PRN

## 2021-10-23 MED ORDER — FENTANYL CITRATE (PF) 100 MCG/2ML IJ SOLN
INTRAMUSCULAR | Status: AC
  Administered 2021-10-23: 50 ug via INTRAVENOUS
  Filled 2021-10-23: qty 2

## 2021-10-23 MED ORDER — LIDOCAINE 2% (20 MG/ML) 5 ML SYRINGE
INTRAMUSCULAR | Status: AC
  Filled 2021-10-23: qty 5

## 2021-10-23 MED ORDER — CHLORHEXIDINE GLUCONATE 0.12 % MT SOLN
15.0000 mL | Freq: Once | OROMUCOSAL | Status: AC
  Administered 2021-10-23: 15 mL via OROMUCOSAL
  Filled 2021-10-23: qty 15

## 2021-10-23 MED ORDER — FENTANYL CITRATE (PF) 100 MCG/2ML IJ SOLN
INTRAMUSCULAR | Status: AC
  Filled 2021-10-23: qty 2

## 2021-10-23 MED ORDER — DEXAMETHASONE SODIUM PHOSPHATE 10 MG/ML IJ SOLN
INTRAMUSCULAR | Status: AC
  Filled 2021-10-23: qty 1

## 2021-10-23 MED ORDER — ONDANSETRON HCL 4 MG/2ML IJ SOLN
INTRAMUSCULAR | Status: DC | PRN
  Administered 2021-10-23: 4 mg via INTRAVENOUS

## 2021-10-23 MED ORDER — KETOROLAC TROMETHAMINE 30 MG/ML IJ SOLN
INTRAMUSCULAR | Status: AC
  Filled 2021-10-23: qty 1

## 2021-10-23 MED ORDER — ACETAMINOPHEN 500 MG PO TABS
1000.0000 mg | ORAL_TABLET | Freq: Once | ORAL | Status: AC
  Administered 2021-10-23: 1000 mg via ORAL
  Filled 2021-10-23: qty 2

## 2021-10-23 MED ORDER — 0.9 % SODIUM CHLORIDE (POUR BTL) OPTIME
TOPICAL | Status: DC | PRN
  Administered 2021-10-23: 1000 mL

## 2021-10-23 MED ORDER — KETOROLAC TROMETHAMINE 30 MG/ML IJ SOLN
INTRAMUSCULAR | Status: DC | PRN
  Administered 2021-10-23: 30 mg via INTRAVENOUS

## 2021-10-23 MED ORDER — ORAL CARE MOUTH RINSE: 15.0000 mL | Freq: Once | OROMUCOSAL | Status: AC

## 2021-10-23 MED ORDER — SUGAMMADEX SODIUM 200 MG/2ML IV SOLN
INTRAVENOUS | Status: DC | PRN
  Administered 2021-10-23: 400 mg via INTRAVENOUS

## 2021-10-23 MED ORDER — PHENYLEPHRINE 80 MCG/ML (10ML) SYRINGE FOR IV PUSH (FOR BLOOD PRESSURE SUPPORT)
PREFILLED_SYRINGE | INTRAVENOUS | Status: DC | PRN
  Administered 2021-10-23 (×2): 160 ug via INTRAVENOUS

## 2021-10-23 MED ORDER — OXYCODONE-ACETAMINOPHEN 5-325 MG PO TABS: 1.0000 | ORAL_TABLET | ORAL | 0 refills | Status: AC | PRN

## 2021-10-23 MED ORDER — ALBUTEROL SULFATE HFA 108 (90 BASE) MCG/ACT IN AERS
INHALATION_SPRAY | RESPIRATORY_TRACT | Status: DC | PRN
  Administered 2021-10-23: 2 via RESPIRATORY_TRACT
  Administered 2021-10-23: 4 via RESPIRATORY_TRACT

## 2021-10-23 MED ORDER — PROPOFOL 10 MG/ML IV BOLUS
INTRAVENOUS | Status: AC
  Filled 2021-10-23: qty 20

## 2021-10-23 SURGICAL SUPPLY — 44 items
BAG COUNTER SPONGE SURGICOUNT (BAG) ×2 IMPLANT
BIT DRILL 110X2.5XQCK CNCT (BIT) IMPLANT
BIT DRILL 2.5 (BIT) ×2
BIT DRL 110X2.5XQCK CNCT (BIT) ×1
BNDG COHESIVE 4X5 TAN STRL (GAUZE/BANDAGES/DRESSINGS) ×2 IMPLANT
COVER SURGICAL LIGHT HANDLE (MISCELLANEOUS) ×2 IMPLANT
DRAPE DERMATAC (DRAPES) ×2 IMPLANT
DRAPE OEC MINIVIEW 54X84 (DRAPES) IMPLANT
DRAPE U-SHAPE 47X51 STRL (DRAPES) ×2 IMPLANT
DURAPREP 26ML APPLICATOR (WOUND CARE) ×2 IMPLANT
ELECT REM PT RETURN 9FT ADLT (ELECTROSURGICAL) ×2
ELECTRODE REM PT RTRN 9FT ADLT (ELECTROSURGICAL) ×1 IMPLANT
GLOVE BIOGEL PI IND STRL 9 (GLOVE) ×1 IMPLANT
GLOVE BIOGEL PI INDICATOR 9 (GLOVE) ×1
GLOVE SURG ORTHO 9.0 STRL STRW (GLOVE) ×2 IMPLANT
GOWN STRL REUS W/ TWL XL LVL3 (GOWN DISPOSABLE) ×3 IMPLANT
GOWN STRL REUS W/TWL XL LVL3 (GOWN DISPOSABLE) ×6
GUIDEWIRE PIN ORTH 6X1.6XSMTH (WIRE) IMPLANT
K-WIRE 1.6 (WIRE) ×2
KIT BASIN OR (CUSTOM PROCEDURE TRAY) ×2 IMPLANT
KIT TURNOVER KIT B (KITS) ×2 IMPLANT
MANIFOLD NEPTUNE II (INSTRUMENTS) ×2 IMPLANT
NS IRRIG 1000ML POUR BTL (IV SOLUTION) ×2 IMPLANT
PACK ORTHO EXTREMITY (CUSTOM PROCEDURE TRAY) ×2 IMPLANT
PAD ARMBOARD 7.5X6 YLW CONV (MISCELLANEOUS) ×4 IMPLANT
PLATE 7HOLE 1/3 TUBULAR (Plate) ×1 IMPLANT
PLATE SMALL FRAG 3.5X49 4H (Plate) ×1 IMPLANT
PREVENA RESTOR AXIOFORM 29X28 (GAUZE/BANDAGES/DRESSINGS) ×1 IMPLANT
SCREW CANN 1/2THD 40X4.0 (Screw) ×2 IMPLANT
SCREW CORT 2.5X20X3.5XST SM (Screw) IMPLANT
SCREW CORTICAL 3.5 16MM (Screw) ×1 IMPLANT
SCREW CORTICAL 3.5X12 (Screw) ×1 IMPLANT
SCREW CORTICAL 3.5X14 (Screw) ×1 IMPLANT
SCREW CORTICAL 3.5X20 (Screw) ×2 IMPLANT
SCREWDRIVER CANN QC F/3.5X2.5 (INSTRUMENTS) ×1 IMPLANT
STAPLER VISISTAT 35W (STAPLE) IMPLANT
SUCTION FRAZIER HANDLE 10FR (MISCELLANEOUS) ×2
SUCTION TUBE FRAZIER 10FR DISP (MISCELLANEOUS) ×1 IMPLANT
SUT ETHILON 2 0 PSLX (SUTURE) ×2 IMPLANT
SUT VIC AB 2-0 CT1 27 (SUTURE) ×2
SUT VIC AB 2-0 CT1 TAPERPNT 27 (SUTURE) ×1 IMPLANT
TOWEL GREEN STERILE (TOWEL DISPOSABLE) ×2 IMPLANT
TOWEL GREEN STERILE FF (TOWEL DISPOSABLE) ×2 IMPLANT
TUBE CONNECTING 12X1/4 (SUCTIONS) ×2 IMPLANT

## 2021-10-23 NOTE — Transfer of Care (Signed)
Immediate Anesthesia Transfer of Care Note  Patient: Carol [name]  Procedure(s) Performed: OPEN REDUCTION INTERNAL FIXATION (ORIF) LEFT ANKLE FRACTURE (Left: Ankle)  Patient Location: PACU  Anesthesia Type:General and Regional  Level of Consciousness: drowsy and patient cooperative  Airway & Oxygen Therapy: Patient Spontanous Breathing and Patient connected to face mask oxygen  Post-op Assessment: Report given to RN and Post -op Vital signs reviewed and stable  Post vital signs: Reviewed and stable  Last Vitals:  Vitals Value Taken Time  BP 96/47 10/23/21 1126  Temp 36.2 C 10/23/21 1115  Pulse 105 10/23/21 1122  Resp 29 10/23/21 1126  SpO2 100 % 10/23/21 1122  Vitals shown include unvalidated device data.  Last Pain:  Vitals:   10/23/21 1115  TempSrc:   PainSc: Asleep         Complications: No notable events documented.

## 2021-10-23 NOTE — Interval H&P Note (Signed)
History and Physical Interval Note:  10/23/2021 9:59 AM  Carol [name]  has presented today for surgery, with the diagnosis of Bimalleolar Left Ankle Fracture.  The various methods of treatment have been discussed with the patient and family. After consideration of risks, benefits and other options for treatment, the patient has consented to  Procedure(s): OPEN REDUCTION INTERNAL FIXATION (ORIF) LEFT ANKLE FRACTURE (Left) as a surgical intervention.  The patient's history has been reviewed, patient examined, no change in status, stable for surgery.  I have reviewed the patient's chart and labs.  Questions were answered to the patient's satisfaction.     Newt Minion

## 2021-10-23 NOTE — Anesthesia Procedure Notes (Signed)
Procedure Name: Intubation Date/Time: 10/23/2021 10:25 AM  Performed by: Gwyndolyn Saxon, CRNAPre-anesthesia Checklist: Patient identified, Emergency Drugs available, Suction available and Patient being monitored Patient Re-evaluated:Patient Re-evaluated prior to induction Oxygen Delivery Method: Circle system utilized Preoxygenation: Pre-oxygenation with 100% oxygen Induction Type: IV induction Ventilation: Two handed mask ventilation required Laryngoscope Size: Miller and 2 Grade View: Grade I Tube type: Oral Tube size: 7.0 mm Number of attempts: 1 Airway Equipment and Method: Patient positioned with wedge pillow and Stylet Placement Confirmation: ETT inserted through vocal cords under direct vision, positive ETCO2 and breath sounds checked- equal and bilateral Secured at: 21 cm Tube secured with: Tape Dental Injury: Teeth and Oropharynx as per pre-operative assessment  Comments: Pt with poor dentition; nothing appearing loose prior to induction/intubation. Teeth intact after intubation

## 2021-10-23 NOTE — H&P (Signed)
Carol [name] is an 31 y.o. female.   Chief Complaint: Closed left ankle fracture HPI: Patient is a 31 year old woman who is seen for initial evaluation for injury to both lower extremities.  Patient states that she had a ground-level fall and went to the emergency room on the ninth status post a trip and fall.  Radiographs showed a bimalleolar ankle fracture.  She is placed in a sugar-tong splint.  Patient also complains of lateral right foot and ankle pain.  Patient states there is no way for her to elevate her foot at home.  Past medical history positive for tobacco she states she is not taking the Neurontin anymore.  Past Medical History:  Diagnosis Date   Abdominal pain    right    Allergy    Anemia    Anxiety    Asthma    Bipolar disorder ([facility])    Constipation    Depression    Hypothyroid    Menorrhagia    Migraine headache    Nocturnal headaches    due to birth control pills   Other specified disease of hair and hair follicles    Panic disorder    Sore throat    Thyroid disease     Past Surgical History:  Procedure Laterality Date   CHOLECYSTECTOMY  03/12/11   TONSILLECTOMY      History reviewed. No pertinent family history. Social History:  reports that she has been smoking cigarettes. She has a 1.00 pack-year smoking history. She has never used smokeless tobacco. She reports that she does not drink alcohol and does not use drugs.  Allergies:  Allergies  Allergen Reactions   Codeine Anaphylaxis    No medications prior to admission.    No results found for this or any previous visit (from the past 48 hour(s)). XR Foot 2 Views Right  Result Date: 10/22/2021 2 view radiographs of the right foot shows no displacement across the midfoot no fractures.   Review of Systems  All other systems reviewed and are negative.   Height 4' 10.5" (1.486 m), last menstrual period 10/09/2021. Physical Exam  Patient is alert, oriented, no adenopathy, well-dressed, normal  affect, normal respiratory effort. Examination patient has a palpable pulse bilaterally she has swelling in both lower extremities examination of the right foot she has ecchymosis and bruising over the anterior talofibular ligament she is tender to palpation over this location the midfoot and forefoot are nontender to palpation.  Examination the left foot she has fracture blisters dorsally over the ankle but there are no open blisters medially or laterally she has ecchymosis and bruising from her foot being dependent.  Review of the radiographs shows a shortened and displaced Weber B fibular fracture with a completely displaced medial malleolar fracture.  No displaced posterior malleolar fracture.  No widening of the syndesmosis. Assessment/Plan 1. Pain in right foot   2. Bimalleolar ankle fracture, left, closed, initial encounter       Plan: With the displaced bimalleolar left ankle fracture have recommended proceeding with open reduction internal fixation.  Risk and benefits were discussed including infection neurovascular injury nonhealing the skin nonhealing the bone need for additional surgery.  Discussed the increased risk of infection with smoking and I have recommended smoking cessation.  Discussed with the infection she is at risk of amputation of the foot.  Patient states she understands wished to proceed at this time.  Newt Minion, MD 10/23/2021, 6:45 AM

## 2021-10-23 NOTE — Anesthesia Preprocedure Evaluation (Addendum)
Anesthesia Evaluation  Patient identified by MRN, date of birth, ID band Patient awake    Reviewed: Allergy & Precautions, NPO status , Patient's Chart, lab work & pertinent test results  Airway Mallampati: II  TM Distance: >3 FB Neck ROM: Full    Dental  (+) Chipped, Dental Advisory Given, Poor Dentition,    Pulmonary asthma , Current SmokerPatient did not abstain from smoking.,    Pulmonary exam normal breath sounds clear to auscultation       Cardiovascular negative cardio ROS Normal cardiovascular exam Rhythm:Regular Rate:Normal     Neuro/Psych  Headaches, PSYCHIATRIC DISORDERS Anxiety Depression Bipolar Disorder    GI/Hepatic negative GI ROS, Neg liver ROS,   Endo/Other  Hypothyroidism Morbid obesity (BMI 47)  Renal/GU negative Renal ROS  negative genitourinary   Musculoskeletal negative musculoskeletal ROS (+)   Abdominal   Peds  Hematology negative hematology ROS (+)   Anesthesia Other Findings   Reproductive/Obstetrics                           Anesthesia Physical Anesthesia Plan  ASA: 3  Anesthesia Plan: General and Regional   Post-op Pain Management: Regional block* and Tylenol PO (pre-op)*   Induction: Intravenous  PONV Risk Score and Plan: 2 and Ondansetron, Dexamethasone and Midazolam  Airway Management Planned: LMA  Additional Equipment:   Intra-op Plan:   Post-operative Plan: Extubation in OR  Informed Consent: I have reviewed the patients History and Physical, chart, labs and discussed the procedure including the risks, benefits and alternatives for the proposed anesthesia with the patient or authorized representative who has indicated his/her understanding and acceptance.     Dental advisory given  Plan Discussed with: CRNA  Anesthesia Plan Comments:         Anesthesia Quick Evaluation

## 2021-10-23 NOTE — Op Note (Signed)
10/23/2021  11:11 AM  PATIENT:  Carol [name]    PRE-OPERATIVE DIAGNOSIS:  Bimalleolar Left Ankle Fracture  POST-OPERATIVE DIAGNOSIS:  Same  PROCEDURE:  OPEN REDUCTION INTERNAL FIXATION (ORIF) LEFT ANKLE FRACTURE C arm fluoroscopy to verify reduction.   SURGEON:  Newt Minion, MD  PHYSICIAN ASSISTANT:None ANESTHESIA:   General  PREOPERATIVE INDICATIONS:  Carol [name] is a  31 y.o. female with a diagnosis of Bimalleolar Left Ankle Fracture who failed conservative measures and elected for surgical management.    The risks benefits and alternatives were discussed with the patient preoperatively including but not limited to the risks of infection, bleeding, nerve injury, cardiopulmonary complications, the need for revision surgery, among others, and the patient was willing to proceed.  OPERATIVE IMPLANTS: 7 hole one third tubular plate laterally 2 screws medially  '@ENCIMAGES'$ @  OPERATIVE FINDINGS: C-arm fluoroscopy verified reduction of the mortise no widening of the syndesmosis  OPERATIVE PROCEDURE: Patient brought the operating room underwent a general anesthetic.  After adequate levels anesthesia were obtained patient's left lower extremity was prepped using DuraPrep draped into a sterile field a timeout was called.  Incision was made laterally over the fibula this was carried sharply down to bone and the fracture margins were freshened the fracture was reduced clamped and a lag screw was placed to stabilize the fracture.  A antiglide plate was then applied posterior laterally locked proximally with 2 screws and distally with 1 screw.  See arthroscopy verified restoration of the length of the fibula and a congruent syndesmosis.  The wound was irrigated normal saline incision closed using 2-0 nylon.  Incision was made medially this was carried sharply down to the fracture the fracture edges were freshened reduced and stabilized with 2 K wires.  C-arm possibly verified reduction and 2  cannulated 40 mm screws were used to secure the fracture.  Wound was irrigated with normal saline incision closed using 2-0 nylon.  C-arm fluoroscopy verified reduction of the mortise.  A Prevena axial form was applied due to the blisters and the compromised soft tissue.  This had a good suction fit patient was extubated taken the PACU in stable condition.   DISCHARGE PLANNING:  Antibiotic duration: Preoperative antibiotics  Weightbearing: Touchdown weightbearing on the left  Pain medication: Prescription for Percocet  Dressing care/ Wound VAC: Wound VAC for 1 week  Ambulatory devices: Walker or crutches  Discharge to: Home.  Follow-up: In the office 1 week post operative.

## 2021-10-24 MED ORDER — DEXAMETHASONE SODIUM PHOSPHATE 10 MG/ML IJ SOLN
INTRAMUSCULAR | Status: DC | PRN
  Administered 2021-10-23 (×2): 5 mg

## 2021-10-24 MED ORDER — ROPIVACAINE HCL 5 MG/ML IJ SOLN
INTRAMUSCULAR | Status: DC | PRN
  Administered 2021-10-23: 30 mL via PERINEURAL
  Administered 2021-10-23: 20 mL via PERINEURAL

## 2021-10-24 NOTE — Anesthesia Postprocedure Evaluation (Signed)
Anesthesia Post Note  Patient: Carol [name]  Procedure(s) Performed: OPEN REDUCTION INTERNAL FIXATION (ORIF) LEFT ANKLE FRACTURE (Left: Ankle)     Patient location during evaluation: PACU Anesthesia Type: Regional and General Level of consciousness: awake and alert Pain management: pain level controlled Vital Signs Assessment: post-procedure vital signs reviewed and stable Respiratory status: spontaneous breathing, nonlabored ventilation, respiratory function stable and patient connected to nasal cannula oxygen Cardiovascular status: blood pressure returned to baseline and stable Postop Assessment: no apparent nausea or vomiting Anesthetic complications: no   No notable events documented.  Last Vitals:  Vitals:   10/23/21 1230 10/23/21 1245  BP: (!) 110/56 (!) 110/56  Pulse: 87 96  Resp: 20 20  Temp:  36.4 C  SpO2: 98% 91%    Last Pain:  Vitals:   10/23/21 1245  TempSrc:   PainSc: 0-No pain                 Trell Secrist L Termaine Roupp

## 2021-10-24 NOTE — Anesthesia Procedure Notes (Signed)
Anesthesia Regional Block: Popliteal block   Pre-Anesthetic Checklist: , timeout performed,  Correct Patient, Correct Site, Correct Laterality,  Correct Procedure, Correct Position, site marked,  Risks and benefits discussed,  Pre-op evaluation,  At surgeon's request and post-op pain management  Laterality: Left  Prep: Maximum Sterile Barrier Precautions used, chloraprep       Needles:  Injection technique: Single-shot  Needle Type: Echogenic Stimulator Needle     Needle Length: 9cm  Needle Gauge: 21     Additional Needles:   Procedures:,,,, ultrasound used (permanent image in chart),,    Narrative:  Start time: 10/23/2021 9:30 AM End time: 10/23/2021 9:35 AM Injection made incrementally with aspirations every 5 mL. Anesthesiologist: Freddrick March, MD

## 2021-10-24 NOTE — Anesthesia Procedure Notes (Signed)
Anesthesia Regional Block: Adductor canal block   Pre-Anesthetic Checklist: , timeout performed,  Correct Patient, Correct Site, Correct Laterality,  Correct Procedure, Correct Position, site marked,  Risks and benefits discussed,  Pre-op evaluation,  At surgeon's request and post-op pain management  Laterality: Left  Prep: Maximum Sterile Barrier Precautions used, chloraprep       Needles:  Injection technique: Single-shot  Needle Type: Echogenic Stimulator Needle     Needle Length: 9cm  Needle Gauge: 21     Additional Needles:   Procedures:,,,, ultrasound used (permanent image in chart),,    Narrative:  Start time: 10/23/2021 9:35 AM End time: 10/23/2021 9:39 AM Injection made incrementally with aspirations every 5 mL. Anesthesiologist: Freddrick March, MD

## 2021-10-26 ENCOUNTER — Other Ambulatory Visit: Payer: Self-pay | Admitting: Orthopedic Surgery

## 2021-10-26 ENCOUNTER — Telehealth: Payer: Self-pay | Admitting: Orthopedic Surgery

## 2021-10-26 MED ORDER — OXYCODONE-ACETAMINOPHEN 10-325 MG PO TABS: 1.0000 | ORAL_TABLET | Freq: Three times a day (TID) | ORAL | 0 refills | Status: AC | PRN

## 2021-10-26 NOTE — Telephone Encounter (Signed)
Patient states she only has 4 oxycodone left being they could only give 9 pills because they were short on '5mg'$  pills please put out a new [facility] for 10 MG [facility] when ready

## 2021-10-26 NOTE — Telephone Encounter (Signed)
Called pt to advise lm on vm to cb with questions.

## 2021-10-26 NOTE — Telephone Encounter (Signed)
ORIF left ankle 10/23/21 please read message below and advise.

## 2021-10-28 ENCOUNTER — Encounter (HOSPITAL_COMMUNITY): Payer: Self-pay | Admitting: Orthopedic Surgery

## 2021-11-02 ENCOUNTER — Encounter: Payer: Medicaid Other | Admitting: Orthopedic Surgery

## 2021-11-03 ENCOUNTER — Ambulatory Visit (INDEPENDENT_AMBULATORY_CARE_PROVIDER_SITE_OTHER): Payer: Self-pay

## 2021-11-03 ENCOUNTER — Ambulatory Visit (INDEPENDENT_AMBULATORY_CARE_PROVIDER_SITE_OTHER): Payer: Medicaid Other | Admitting: Orthopedic Surgery

## 2021-11-03 DIAGNOSIS — Z981 Arthrodesis status: Secondary | ICD-10-CM

## 2021-11-03 DIAGNOSIS — S82892D Other fracture of left lower leg, subsequent encounter for closed fracture with routine healing: Secondary | ICD-10-CM

## 2021-11-11 ENCOUNTER — Ambulatory Visit (INDEPENDENT_AMBULATORY_CARE_PROVIDER_SITE_OTHER): Payer: Self-pay | Admitting: Family

## 2021-11-11 ENCOUNTER — Ambulatory Visit (HOSPITAL_COMMUNITY)
Admission: RE | Admit: 2021-11-11 | Discharge: 2021-11-11 | Disposition: A | Discharge status: Home / Self Care | Payer: Medicaid Other | Source: Ambulatory Visit | Attending: Family | Admitting: Family

## 2021-11-11 ENCOUNTER — Encounter: Payer: Self-pay | Admitting: Orthopedic Surgery

## 2021-11-11 ENCOUNTER — Encounter: Payer: Self-pay | Admitting: Family

## 2021-11-11 DIAGNOSIS — S82892D Other fracture of left lower leg, subsequent encounter for closed fracture with routine healing: Secondary | ICD-10-CM

## 2021-11-11 NOTE — Progress Notes (Signed)
Office Visit Note   Patient: Carol [name]           Date of Birth: May 09, 1991           MRN: [id number] Visit Date: 11/03/2021              Requested by: No referring provider defined for this encounter. PCP: System, Provider Not In  Chief Complaint  Patient presents with   Left Ankle - Routine Post Op    10/23/21 ORIF left ankle fracture      HPI: Patient is a 31 year old woman who is 1 week status post open reduction internal fixation bimalleolar left ankle fracture.  Assessment & Plan: Visit Diagnoses:  1. S/P ankle fusion   2. Ankle fracture, left, closed, with routine healing, subsequent encounter     Plan: Patient has developed a superficial decubitus heel ulcer discussed the importance of floating her heel and keeping pressure off the heel the importance of nonweightbearing elevation and working on range of motion of the toes and ankle.  Plan to remove the sutures at follow-up in 1 week.  Follow-Up Instructions: Return in about 1 week (around 11/10/2021).   Ortho Exam  Patient is alert, oriented, no adenopathy, well-dressed, normal affect, normal respiratory effort. The surgical incisions are healing well there is no redness or cellulitis.  Patient has developed a superficial decubitus ulcer on the heel and discussed the importance of floating her heel.  Imaging: No results found. No images are attached to the encounter.  Labs: Lab Results  Component Value Date   REPTSTATUS 06/11/2014 FINAL 06/10/2014   CULT  06/10/2014    Multiple bacterial morphotypes present, none predominant. Suggest appropriate recollection if clinically indicated. Performed at [facility] Results  Component Value Date   ALBUMIN 3.7 06/10/2014   ALBUMIN 3.4 (L) 11/06/2012   ALBUMIN 3.3 (L) 10/04/2012    No results found for: "MG" No results found for: "VD25OH"  No results found for: "PREALBUMIN"    Latest Ref Rng & Units 10/23/2021    9:13 AM 06/10/2014     5:51 PM 11/06/2012    3:26 PM  CBC EXTENDED  WBC 4.0 - 10.5 K/uL  13.6  14.0   RBC 3.87 - 5.11 MIL/uL  4.59  4.89   Hemoglobin 12.0 - 15.0 g/dL 10.5  12.1  13.1   HCT 36.0 - 46.0 % 31.0  38.0  40.1   Platelets 150 - 400 K/uL  443  457   NEUT# 1.7 - 7.7 K/uL  9.7    Lymph# 0.7 - 4.0 K/uL  2.3       There is no height or weight on file to calculate BMI.  Orders:  Orders Placed This Encounter  Procedures   XR Ankle Complete Left   No orders of the defined types were placed in this encounter.    Procedures: No procedures performed  Clinical Data: No additional findings.  ROS:  All other systems negative, except as noted in the HPI. Review of Systems  Objective: Vital Signs: LMP 10/09/2021   Specialty Comments:  No specialty comments available.  PMFS History: Patient Active Problem List   Diagnosis Date Noted   Ankle fracture, bimalleolar, closed, left, initial encounter    Severe recurrent major depression without psychotic features ([place]) 06/11/2014   Overdose 06/11/2014   Suicide attempt ([place]) 06/11/2014   PTSD (post-traumatic stress disorder) 11/13/2012   Anemia 09/30/2011   Chronic cholecystitis with calculus  02/11/2011   Past Medical History:  Diagnosis Date   Abdominal pain    right    Allergy    Anemia    Anxiety    Asthma    Bipolar disorder ([place])    Constipation    Depression    Hypothyroid    Menorrhagia    Migraine headache    Nocturnal headaches    due to birth control pills   Other specified disease of hair and hair follicles    Panic disorder    Sore throat    Thyroid disease     History reviewed. No pertinent family history.  Past Surgical History:  Procedure Laterality Date   CHOLECYSTECTOMY  03/12/11   ORIF ANKLE FRACTURE Left 10/23/2021   Procedure: OPEN REDUCTION INTERNAL FIXATION (ORIF) LEFT ANKLE FRACTURE;  Surgeon: Newt Minion, MD;  Location: [facility];  Service: Orthopedics;  Laterality: Left;   TONSILLECTOMY     Social  History   Occupational History   Not on file  Tobacco Use   Smoking status: Every Day    Packs/day: 1.00    Years: 1.00    Total pack years: 1.00    Types: Cigarettes   Smokeless tobacco: Never  Substance and Sexual Activity   Alcohol use: No   Drug use: No   Sexual activity: Not Currently    Birth control/protection: Pill

## 2021-11-11 NOTE — Progress Notes (Addendum)
Lower extremity venous left study completed.   Attempted to call routine results to provider.  Please see CV Proc for preliminary results.   Darlin Coco, RDMS, RVT

## 2021-11-11 NOTE — Progress Notes (Signed)
   Post-Op Visit Note   Patient: Carol [name]           Date of Birth: 1990/12/05           MRN: [id number] Visit Date: 11/11/2021 PCP: System, Provider Not In  Chief Complaint: No chief complaint on file.   HPI:  HPI The patient is a 31 year old woman who presents status post left ankle open reduction with internal fixation Ortho Exam Does have significant swelling of her medial thigh and posterior knee there is tenderness there is no associated erythema.  This is quite distressing has been ongoing since surgery she states she feels numbness. on examination of the left ankle today she does have pitting edema there is no erythema her incisions are well-healed.  Sutures harvested today without incident. Distal NV intact.    Visit Diagnoses:  1. Ankle fracture, left, closed, with routine healing, subsequent encounter     Plan: We will send for ultrasound rule out DVT today left lower extremity.  Begin daily Dial soap cleansing.  Sutures harvested.  She may shower may get this wet continue CAM Walker continue nonweightbearing.  Radiographs in 2 weeks at follow-up  Follow-Up Instructions: No follow-ups on file.   Imaging: No results found.  Orders:  Orders Placed This Encounter  Procedures   VAS [place] LOWER EXTREMITY VENOUS (DVT)   No orders of the defined types were placed in this encounter.    PMFS History: Patient Active Problem List   Diagnosis Date Noted   Ankle fracture, bimalleolar, closed, left, initial encounter    Severe recurrent major depression without psychotic features ([place]) 06/11/2014   Overdose 06/11/2014   Suicide attempt ([place]) 06/11/2014   PTSD (post-traumatic stress disorder) 11/13/2012   Anemia 09/30/2011   Chronic cholecystitis with calculus 02/11/2011   Past Medical History:  Diagnosis Date   Abdominal pain    right    Allergy    Anemia    Anxiety    Asthma    Bipolar disorder ([place])    Constipation    Depression    Hypothyroid     Menorrhagia    Migraine headache    Nocturnal headaches    due to birth control pills   Other specified disease of hair and hair follicles    Panic disorder    Sore throat    Thyroid disease     No family history on file.  Past Surgical History:  Procedure Laterality Date   CHOLECYSTECTOMY  03/12/11   ORIF ANKLE FRACTURE Left 10/23/2021   Procedure: OPEN REDUCTION INTERNAL FIXATION (ORIF) LEFT ANKLE FRACTURE;  Surgeon: Newt Minion, MD;  Location: [facility];  Service: Orthopedics;  Laterality: Left;   TONSILLECTOMY     Social History   Occupational History   Not on file  Tobacco Use   Smoking status: Every Day    Packs/day: 1.00    Years: 1.00    Total pack years: 1.00    Types: Cigarettes   Smokeless tobacco: Never  Substance and Sexual Activity   Alcohol use: No   Drug use: No   Sexual activity: Not Currently    Birth control/protection: Pill

## 2021-11-25 ENCOUNTER — Ambulatory Visit (INDEPENDENT_AMBULATORY_CARE_PROVIDER_SITE_OTHER): Payer: Medicaid Other | Admitting: Family

## 2021-11-25 ENCOUNTER — Ambulatory Visit (INDEPENDENT_AMBULATORY_CARE_PROVIDER_SITE_OTHER): Payer: Self-pay

## 2021-11-25 ENCOUNTER — Encounter: Payer: Self-pay | Admitting: Family

## 2021-11-25 DIAGNOSIS — Z981 Arthrodesis status: Secondary | ICD-10-CM

## 2021-11-25 NOTE — Progress Notes (Signed)
Post-Op Visit Note   Patient: Carol [name]           Date of Birth: 10/02/1990           MRN: [id number] Visit Date: 11/25/2021 PCP: System, Provider Not In  Chief Complaint:  Chief Complaint  Patient presents with   Left Ankle - Routine Post Op    10/23/21 ORIF left ankle fx    HPI:  HPI The patient is a 31 year old woman who presents today in follow-up for open reduction internal fixation of a bimalleolar ankle fracture on the left she is also had a sprain of her right ankle she is in cam walker is full weightbearing bilateral lower extremities  States she has been walking in the home to shower and get to the bathroom barefoot bilaterally without pain Right Ankle Exam   Tenderness  The patient is experiencing tenderness in the ATF and deltoid. Swelling: mild  Range of Motion  The patient has normal right ankle ROM.  Muscle Strength  The patient has normal right ankle strength.  Tests  Anterior drawer: negative  Other  Erythema: absent Pulse: present    Left Ankle Exam   Tenderness  The patient is experiencing tenderness in the lateral malleolus and medial malleolus.   Other  Erythema: absent Pulse: present     On examination of the left ankle her incisions are well-healed she continues with moderate edema she has a posterior heel decubitus ulcer this is 2 cm in diameter 1 mm deep covered with eschar  Visit Diagnoses:  1. S/P ankle fusion     Plan: Encouraged her to continue her boot on her left ankle for 2 more weeks she is to discontinue the cam walker on the right begin working on range of motion in dorsiflexion she is to do the same with her left ankle please keep the heel clean and dry cover with dressing  Follow-Up Instructions: Return in about 15 days (around 12/10/2021).   Imaging: No results found.  Orders:  Orders Placed This Encounter  Procedures   XR Ankle Complete Left   No orders of the defined types were placed in this  encounter.    PMFS History: Patient Active Problem List   Diagnosis Date Noted   Ankle fracture, bimalleolar, closed, left, initial encounter    Severe recurrent major depression without psychotic features ([place]) 06/11/2014   Overdose 06/11/2014   Suicide attempt ([place]) 06/11/2014   PTSD (post-traumatic stress disorder) 11/13/2012   Anemia 09/30/2011   Chronic cholecystitis with calculus 02/11/2011   Past Medical History:  Diagnosis Date   Abdominal pain    right    Allergy    Anemia    Anxiety    Asthma    Bipolar disorder ([place])    Constipation    Depression    Hypothyroid    Menorrhagia    Migraine headache    Nocturnal headaches    due to birth control pills   Other specified disease of hair and hair follicles    Panic disorder    Sore throat    Thyroid disease     History reviewed. No pertinent family history.  Past Surgical History:  Procedure Laterality Date   CHOLECYSTECTOMY  03/12/11   ORIF ANKLE FRACTURE Left 10/23/2021   Procedure: OPEN REDUCTION INTERNAL FIXATION (ORIF) LEFT ANKLE FRACTURE;  Surgeon: Newt Minion, MD;  Location: [facility];  Service: Orthopedics;  Laterality: Left;   TONSILLECTOMY     Social History  Occupational History   Not on file  Tobacco Use   Smoking status: Every Day    Packs/day: 1.00    Years: 1.00    Total pack years: 1.00    Types: Cigarettes   Smokeless tobacco: Never  Substance and Sexual Activity   Alcohol use: No   Drug use: No   Sexual activity: Not Currently    Birth control/protection: Pill

## 2021-12-11 ENCOUNTER — Ambulatory Visit: Payer: Self-pay

## 2021-12-11 ENCOUNTER — Encounter: Payer: Self-pay | Admitting: Family

## 2021-12-11 ENCOUNTER — Ambulatory Visit (INDEPENDENT_AMBULATORY_CARE_PROVIDER_SITE_OTHER): Payer: Self-pay | Admitting: Family

## 2021-12-11 DIAGNOSIS — M25571 Pain in right ankle and joints of right foot: Secondary | ICD-10-CM

## 2021-12-11 DIAGNOSIS — S82842A Displaced bimalleolar fracture of left lower leg, initial encounter for closed fracture: Secondary | ICD-10-CM

## 2021-12-11 NOTE — Progress Notes (Signed)
Office Visit Note   Patient: Carol [name]           Date of Birth: 16-Mar-1991           MRN: [id number] Visit Date: 12/11/2021              Requested by: No referring provider defined for this encounter. PCP: System, Provider Not In  No chief complaint on file.     HPI: The patient is a 31 year old woman who presents in follow-up she is status post open reduction internal fixation of her left ankle she also has been slowly improving from an ankle sprain on the right.  She is in a cam walker full weightbearing on the left has resumed regular shoewear on the right  Overall feels well and is pleased with her progress no concerns voiced today  Assessment & Plan: Visit Diagnoses:  1. Ankle fracture, bimalleolar, closed, left, initial encounter   2. Pain in right ankle and joints of right foot     Plan: Encouraged her to continue working on range of motion of both foot and ankle work on heel cord stretching.  She will follow the left heel continue keeping this covered with a antibacterial ointment and dressing.  Offload pressure from the heel may resume regular shoewear on the left  Follow-Up Instructions: Return in about 4 weeks (around 01/08/2022).   Ortho Exam  Patient is alert, oriented, no adenopathy, well-dressed, normal affect, normal respiratory effort. On examination of the left ankle she has no edema no erythema minimal tenderness to palpation of the left ankle.  Her incisions are well-healed  Imaging: No results found. No images are attached to the encounter.  Labs: Lab Results  Component Value Date   REPTSTATUS 06/11/2014 FINAL 06/10/2014   CULT  06/10/2014    Multiple bacterial morphotypes present, none predominant. Suggest appropriate recollection if clinically indicated. Performed at [facility] Results  Component Value Date   ALBUMIN 3.7 06/10/2014   ALBUMIN 3.4 (L) 11/06/2012   ALBUMIN 3.3 (L) 10/04/2012    No results found  for: "MG" No results found for: "VD25OH"  No results found for: "PREALBUMIN"    Latest Ref Rng & Units 10/23/2021    9:13 AM 06/10/2014    5:51 PM 11/06/2012    3:26 PM  CBC EXTENDED  WBC 4.0 - 10.5 K/uL  13.6  14.0   RBC 3.87 - 5.11 MIL/uL  4.59  4.89   Hemoglobin 12.0 - 15.0 g/dL 10.5  12.1  13.1   HCT 36.0 - 46.0 % 31.0  38.0  40.1   Platelets 150 - 400 K/uL  443  457   NEUT# 1.7 - 7.7 K/uL  9.7    Lymph# 0.7 - 4.0 K/uL  2.3       There is no height or weight on file to calculate BMI.  Orders:  No orders of the defined types were placed in this encounter.  No orders of the defined types were placed in this encounter.    Procedures: No procedures performed  Clinical Data: No additional findings.  ROS:  All other systems negative, except as noted in the HPI. Review of Systems  Objective: Vital Signs: There were no vitals taken for this visit.  Specialty Comments:  No specialty comments available.  PMFS History: Patient Active Problem List   Diagnosis Date Noted   Ankle fracture, bimalleolar, closed, left, initial encounter    Severe recurrent major  depression without psychotic features ([place]) 06/11/2014   Overdose 06/11/2014   Suicide attempt ([place]) 06/11/2014   PTSD (post-traumatic stress disorder) 11/13/2012   Anemia 09/30/2011   Chronic cholecystitis with calculus 02/11/2011   Past Medical History:  Diagnosis Date   Abdominal pain    right    Allergy    Anemia    Anxiety    Asthma    Bipolar disorder ([place])    Constipation    Depression    Hypothyroid    Menorrhagia    Migraine headache    Nocturnal headaches    due to birth control pills   Other specified disease of hair and hair follicles    Panic disorder    Sore throat    Thyroid disease     History reviewed. No pertinent family history.  Past Surgical History:  Procedure Laterality Date   CHOLECYSTECTOMY  03/12/11   ORIF ANKLE FRACTURE Left 10/23/2021   Procedure: OPEN REDUCTION  INTERNAL FIXATION (ORIF) LEFT ANKLE FRACTURE;  Surgeon: Newt Minion, MD;  Location: [facility];  Service: Orthopedics;  Laterality: Left;   TONSILLECTOMY     Social History   Occupational History   Not on file  Tobacco Use   Smoking status: Every Day    Packs/day: 1.00    Years: 1.00    Total pack years: 1.00    Types: Cigarettes   Smokeless tobacco: Never  Substance and Sexual Activity   Alcohol use: No   Drug use: No   Sexual activity: Not Currently    Birth control/protection: Pill

## 2022-01-12 ENCOUNTER — Ambulatory Visit (INDEPENDENT_AMBULATORY_CARE_PROVIDER_SITE_OTHER): Payer: Self-pay | Admitting: Orthopedic Surgery

## 2022-01-12 DIAGNOSIS — S82842A Displaced bimalleolar fracture of left lower leg, initial encounter for closed fracture: Secondary | ICD-10-CM

## 2022-01-19 ENCOUNTER — Encounter: Payer: Self-pay | Admitting: Orthopedic Surgery

## 2022-01-19 NOTE — Progress Notes (Signed)
Office Visit Note   Patient: Carol [name]           Date of Birth: [birth year]           MRN: [id number] Visit Date: 01/12/2022              Requested by: No referring provider defined for this encounter. PCP: System, Provider Not In  Chief Complaint  Patient presents with   Left Foot - Routine Post Op    10/23/2021 OPEN REDUCTION INTERNAL FIXATION (ORIF) LEFT ANKLE FRACTURE      HPI: Patient is a 31 year old woman who is 2-1/2 months status post open reduction internal fixation bimalleolar left ankle fracture.  Assessment & Plan: Visit Diagnoses:  1. Ankle fracture, bimalleolar, closed, left, initial encounter     Plan: Continue with range of motion of the ankle continue with elevation.  Patient is provided a PRAFO to wear while sitting or sleeping to unload the heel ulcer.  Three-view radiographs of the left ankle at follow-up.  Follow-Up Instructions: Return in about 4 weeks (around 02/09/2022).   Ortho Exam  Patient is alert, oriented, no adenopathy, well-dressed, normal affect, normal respiratory effort. Patient is still actively smoking.  The decubitus heel ulcer has healthy granulation tissue measures 1 cm in diameter.  Imaging: No results found. No images are attached to the encounter.  Labs: Lab Results  Component Value Date   REPTSTATUS 06/11/2014 FINAL 06/10/2014   CULT  06/10/2014    Multiple bacterial morphotypes present, none predominant. Suggest appropriate recollection if clinically indicated. Performed at [facility] Results  Component Value Date   ALBUMIN 3.7 06/10/2014   ALBUMIN 3.4 (L) 11/06/2012   ALBUMIN 3.3 (L) 10/04/2012    No results found for: "MG" No results found for: "VD25OH"  No results found for: "PREALBUMIN"    Latest Ref Rng & Units 10/23/2021    9:13 AM 06/10/2014    5:51 PM 11/06/2012    3:26 PM  CBC EXTENDED  WBC 4.0 - 10.5 K/uL  13.6  14.0   RBC 3.87 - 5.11 MIL/uL  4.59  4.89   Hemoglobin  12.0 - 15.0 g/dL 10.5  12.1  13.1   HCT 36.0 - 46.0 % 31.0  38.0  40.1   Platelets 150 - 400 K/uL  443  457   NEUT# 1.7 - 7.7 K/uL  9.7    Lymph# 0.7 - 4.0 K/uL  2.3       There is no height or weight on file to calculate BMI.  Orders:  No orders of the defined types were placed in this encounter.  No orders of the defined types were placed in this encounter.    Procedures: No procedures performed  Clinical Data: No additional findings.  ROS:  All other systems negative, except as noted in the HPI. Review of Systems  Objective: Vital Signs: There were no vitals taken for this visit.  Specialty Comments:  No specialty comments available.  PMFS History: Patient Active Problem List   Diagnosis Date Noted   Ankle fracture, bimalleolar, closed, left, initial encounter    Severe recurrent major depression without psychotic features ([place]) 06/11/2014   Overdose 06/11/2014   Suicide attempt ([place]) 06/11/2014   PTSD (post-traumatic stress disorder) 11/13/2012   Anemia 09/30/2011   Chronic cholecystitis with calculus 02/11/2011   Past Medical History:  Diagnosis Date   Abdominal pain    right    Allergy  Anemia    Anxiety    Asthma    Bipolar disorder ([place])    Constipation    Depression    Hypothyroid    Menorrhagia    Migraine headache    Nocturnal headaches    due to birth control pills   Other specified disease of hair and hair follicles    Panic disorder    Sore throat    Thyroid disease     History reviewed. No pertinent family history.  Past Surgical History:  Procedure Laterality Date   CHOLECYSTECTOMY  03/12/11   ORIF ANKLE FRACTURE Left 10/23/2021   Procedure: OPEN REDUCTION INTERNAL FIXATION (ORIF) LEFT ANKLE FRACTURE;  Surgeon: Newt Minion, MD;  Location: [facility];  Service: Orthopedics;  Laterality: Left;   TONSILLECTOMY     Social History   Occupational History   Not on file  Tobacco Use   Smoking status: Every Day    Packs/day: 1.00     Years: 1.00    Total pack years: 1.00    Types: Cigarettes   Smokeless tobacco: Never  Substance and Sexual Activity   Alcohol use: No   Drug use: No   Sexual activity: Not Currently    Birth control/protection: Pill

## 2022-02-15 ENCOUNTER — Ambulatory Visit (INDEPENDENT_AMBULATORY_CARE_PROVIDER_SITE_OTHER): Payer: Self-pay

## 2022-02-15 ENCOUNTER — Ambulatory Visit (INDEPENDENT_AMBULATORY_CARE_PROVIDER_SITE_OTHER): Payer: Self-pay | Admitting: Orthopedic Surgery

## 2022-02-15 DIAGNOSIS — S82892D Other fracture of left lower leg, subsequent encounter for closed fracture with routine healing: Secondary | ICD-10-CM

## 2022-02-15 DIAGNOSIS — Z981 Arthrodesis status: Secondary | ICD-10-CM

## 2022-02-16 ENCOUNTER — Encounter: Payer: Self-pay | Admitting: Orthopedic Surgery

## 2022-02-16 NOTE — Progress Notes (Signed)
Office Visit Note   Patient: Carol [name]           Date of Birth: December 14, 1990           MRN: [id number] Visit Date: 02/15/2022              Requested by: No referring provider defined for this encounter. PCP: System, Provider Not In  Chief Complaint  Patient presents with   Left Ankle - Follow-up    10/23/2021 OPEN REDUCTION INTERNAL FIXATION (ORIF) LEFT ANKLE FRACTURE      HPI: Patient is a 31 year old woman who is 3 and half months status post open duction internal fixation bimalleolar left ankle fracture.  Assessment & Plan: Visit Diagnoses:  1. Closed fracture of left ankle with routine healing, subsequent encounter   2. Ankle fracture, left, closed, with routine healing, subsequent encounter     Plan: Patient was given instructions and demonstrated Achilles stretching.  She will increase her activities as tolerated no restrictions.  Follow-Up Instructions: Return if symptoms worsen or fail to improve.   Ortho Exam  Patient is alert, oriented, no adenopathy, well-dressed, normal affect, normal respiratory effort. Examination patient walks with her foot externally rotated.  With her knee extended she has dorsiflexion to neutral she was given instructions for Achilles stretching.  The incisions are well-healed there is no redness no cellulitis.  Imaging: XR Ankle Complete Left  Result Date: 02/16/2022 Three-view radiographs of the left ankle shows a congruent mortise with no hardware failure no displacement of the fractures.  No images are attached to the encounter.  Labs: Lab Results  Component Value Date   REPTSTATUS 06/11/2014 FINAL 06/10/2014   CULT  06/10/2014    Multiple bacterial morphotypes present, none predominant. Suggest appropriate recollection if clinically indicated. Performed at [facility] Results  Component Value Date   ALBUMIN 3.7 06/10/2014   ALBUMIN 3.4 (L) 11/06/2012   ALBUMIN 3.3 (L) 10/04/2012    No results  found for: "MG" No results found for: "VD25OH"  No results found for: "PREALBUMIN"    Latest Ref Rng & Units 10/23/2021    9:13 AM 06/10/2014    5:51 PM 11/06/2012    3:26 PM  CBC EXTENDED  WBC 4.0 - 10.5 K/uL  13.6  14.0   RBC 3.87 - 5.11 MIL/uL  4.59  4.89   Hemoglobin 12.0 - 15.0 g/dL 10.5  12.1  13.1   HCT 36.0 - 46.0 % 31.0  38.0  40.1   Platelets 150 - 400 K/uL  443  457   NEUT# 1.7 - 7.7 K/uL  9.7    Lymph# 0.7 - 4.0 K/uL  2.3       There is no height or weight on file to calculate BMI.  Orders:  Orders Placed This Encounter  Procedures   XR Ankle Complete Left   No orders of the defined types were placed in this encounter.    Procedures: No procedures performed  Clinical Data: No additional findings.  ROS:  All other systems negative, except as noted in the HPI. Review of Systems  Objective: Vital Signs: There were no vitals taken for this visit.  Specialty Comments:  No specialty comments available.  PMFS History: Patient Active Problem List   Diagnosis Date Noted   Ankle fracture, bimalleolar, closed, left, initial encounter    Severe recurrent major depression without psychotic features ([place]) 06/11/2014   Overdose 06/11/2014   Suicide attempt ([place]) 06/11/2014  PTSD (post-traumatic stress disorder) 11/13/2012   Anemia 09/30/2011   Chronic cholecystitis with calculus 02/11/2011   Past Medical History:  Diagnosis Date   Abdominal pain    right    Allergy    Anemia    Anxiety    Asthma    Bipolar disorder ([place])    Constipation    Depression    Hypothyroid    Menorrhagia    Migraine headache    Nocturnal headaches    due to birth control pills   Other specified disease of hair and hair follicles    Panic disorder    Sore throat    Thyroid disease     History reviewed. No pertinent family history.  Past Surgical History:  Procedure Laterality Date   CHOLECYSTECTOMY  03/12/11   ORIF ANKLE FRACTURE Left 10/23/2021   Procedure: OPEN  REDUCTION INTERNAL FIXATION (ORIF) LEFT ANKLE FRACTURE;  Surgeon: Newt Minion, MD;  Location: [facility];  Service: Orthopedics;  Laterality: Left;   TONSILLECTOMY     Social History   Occupational History   Not on file  Tobacco Use   Smoking status: Every Day    Packs/day: 1.00    Years: 1.00    Total pack years: 1.00    Types: Cigarettes   Smokeless tobacco: Never  Substance and Sexual Activity   Alcohol use: No   Drug use: No   Sexual activity: Not Currently    Birth control/protection: Pill

## 2022-07-08 ENCOUNTER — Encounter: Payer: Self-pay | Admitting: Radiology

## 2022-08-08 IMAGING — DX DG ANKLE COMPLETE 3+V*L*
3 series · 3 of 3 positions shown · non-contrast
Comparison: None Available.

CLINICAL DATA: Status post fall rolling ankle.  Pain and bruising.

EXAM:
LEFT ANKLE COMPLETE - 3+ VIEW

[ankle ap]
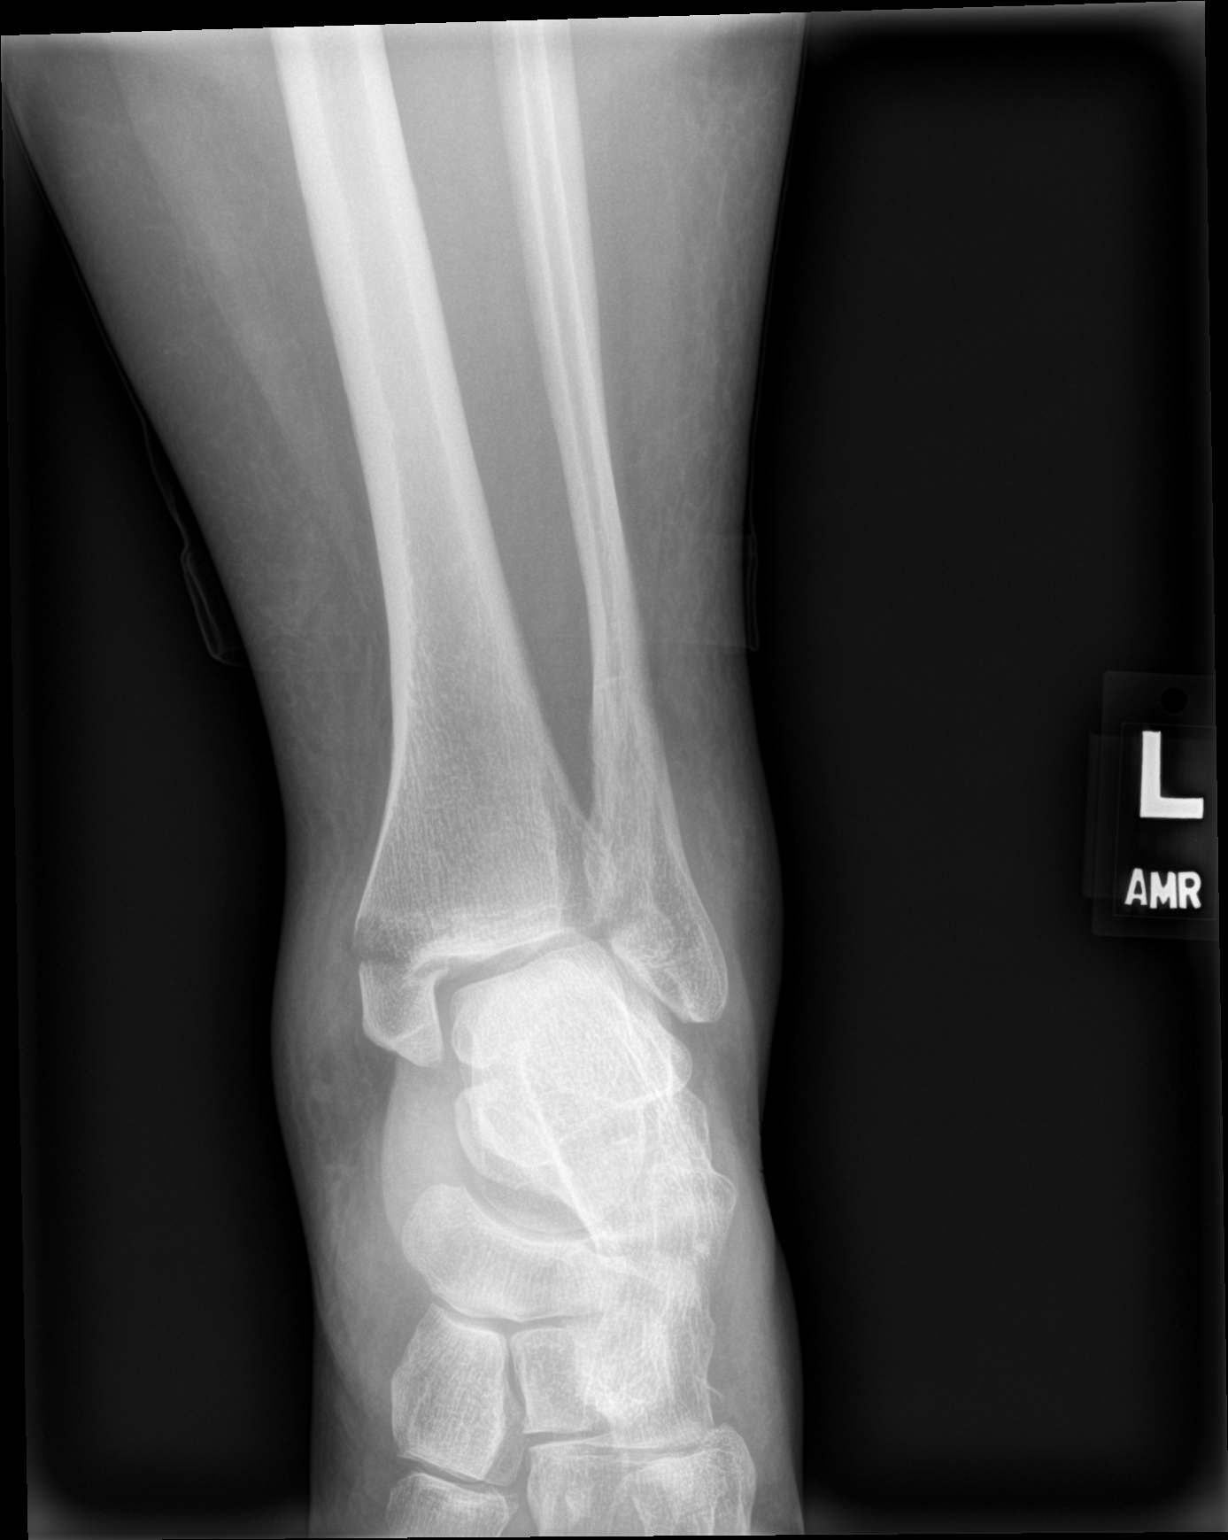

[ankle obl]
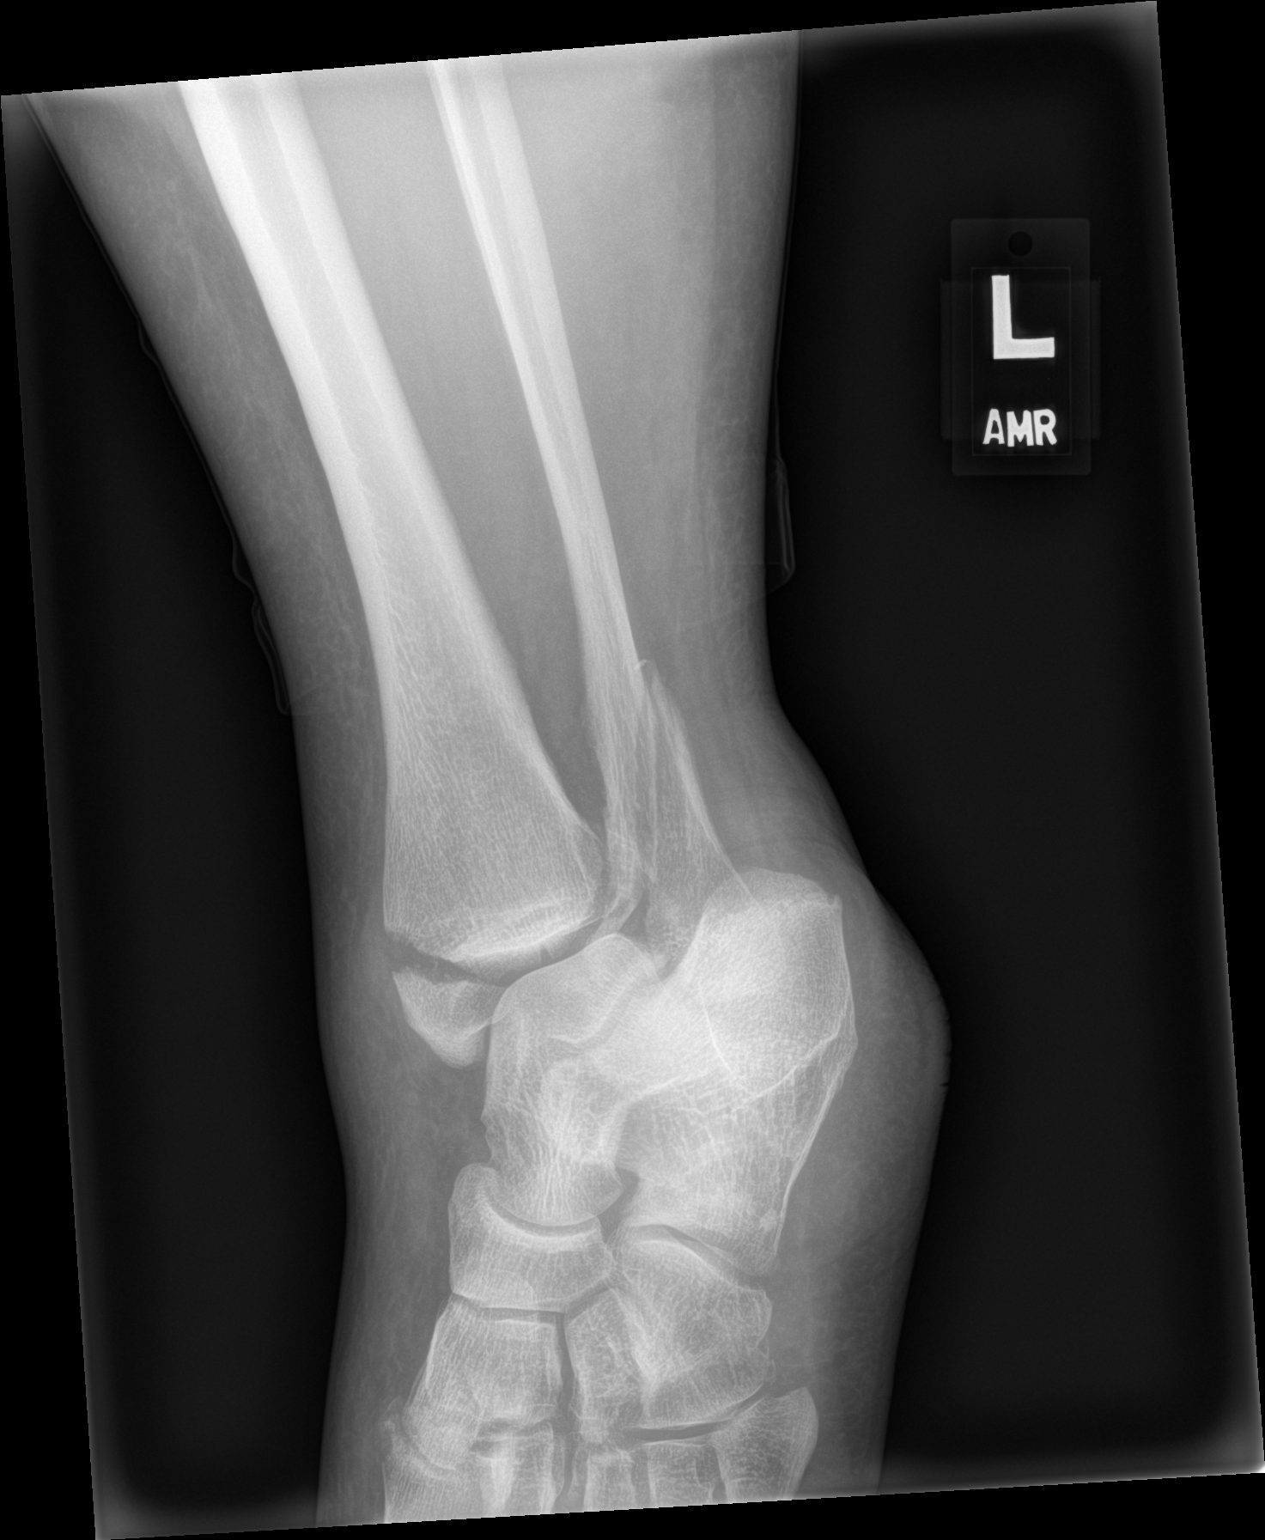

[ankle lat]
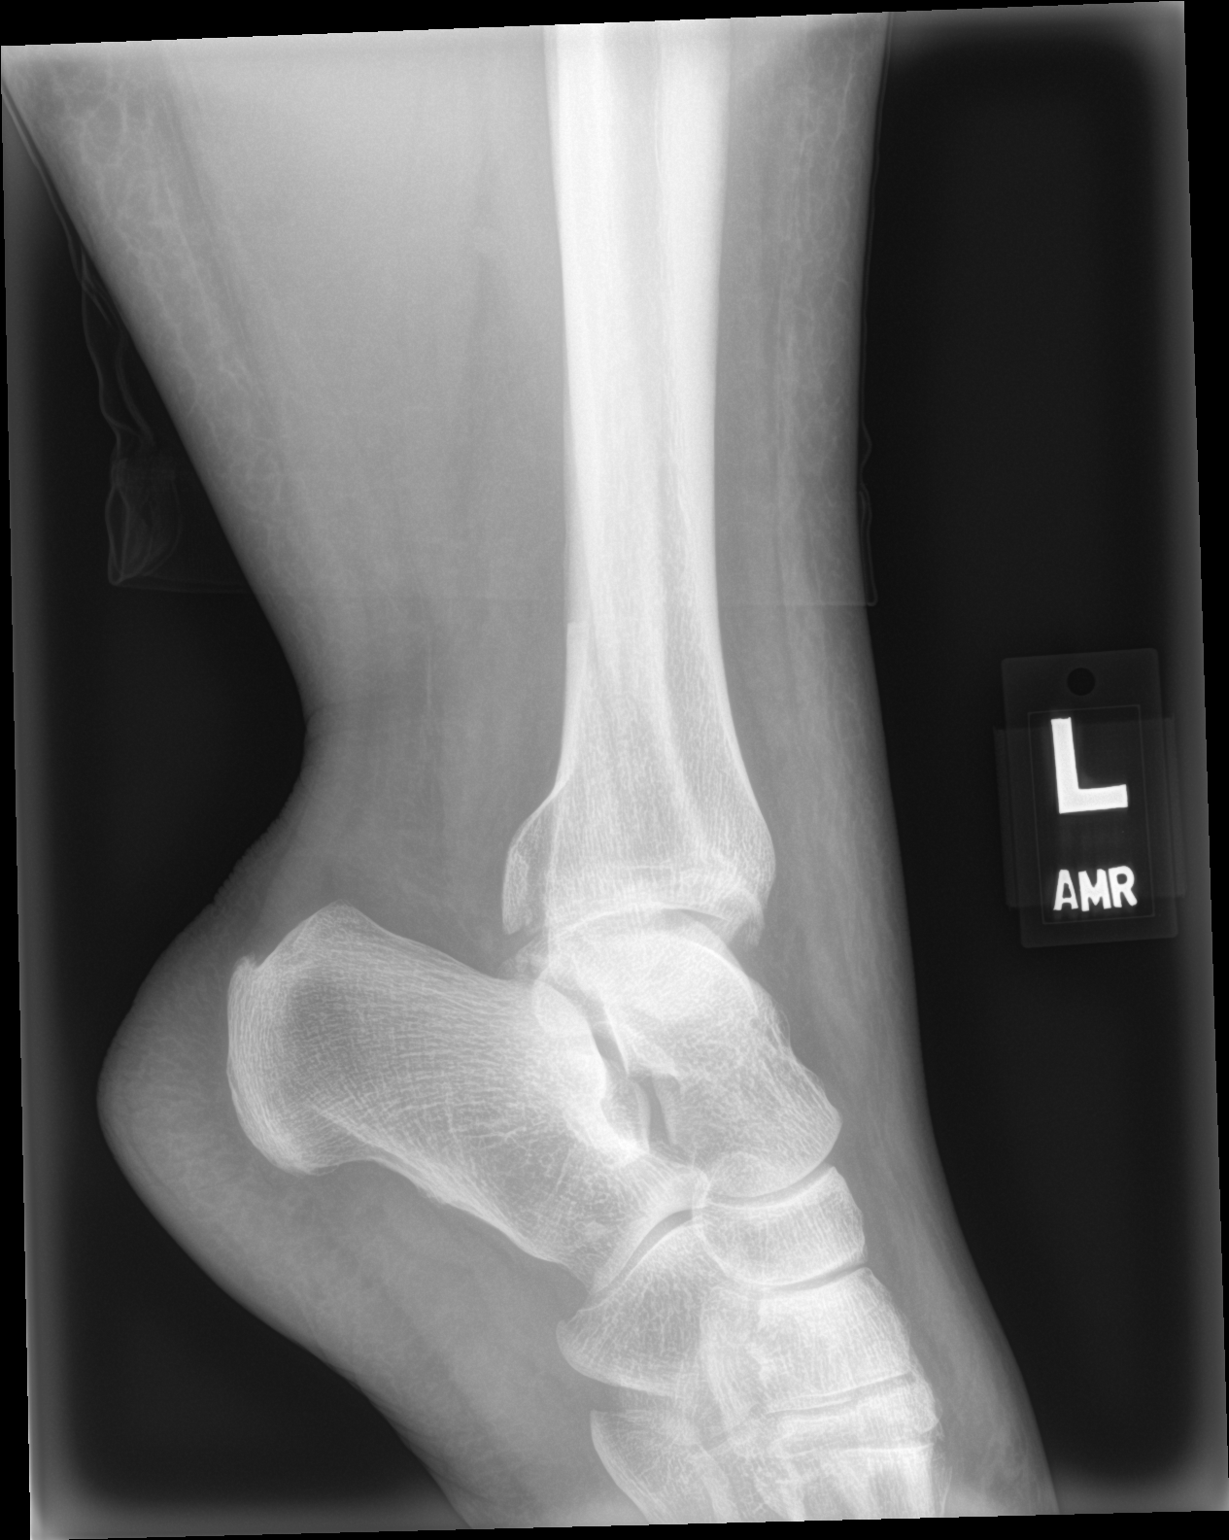

[3 of 3 positions shown; findings below may reference images not displayed]

FINDINGS: There is diffuse soft tissue swelling. There is an acute,
intra-articular obliquely oriented fracture involving the distal
fibula. Medial malleolar fracture is also noted. The fracture
fragments appear to be in near anatomic alignment. No displaced
posterior malleolar fracture noted.
IMPRESSION: 1. Acute fractures involve the medial malleolus and distal fibula.
Fracture fragments are in near anatomic alignment
2. Diffuse soft tissue swelling.
